# Patient Record
Sex: Male | Born: 1980 | Race: White | Hispanic: No | Marital: Married | Smoking: Former smoker
Health system: Southern US, Community
[De-identification: ages and names within clinical notes are randomized; demographics above are authoritative.]

## PROBLEM LIST (undated history)

## (undated) DIAGNOSIS — J40 Bronchitis, not specified as acute or chronic: Secondary | ICD-10-CM

## (undated) DIAGNOSIS — K2270 Barrett's esophagus without dysplasia: Secondary | ICD-10-CM

## (undated) DIAGNOSIS — K219 Gastro-esophageal reflux disease without esophagitis: Secondary | ICD-10-CM

## (undated) HISTORY — PX: WISDOM TOOTH EXTRACTION: SHX21

## (undated) HISTORY — DX: Barrett's esophagus without dysplasia: K22.70

## (undated) HISTORY — DX: Gastro-esophageal reflux disease without esophagitis: K21.9

## (undated) HISTORY — DX: Bronchitis, not specified as acute or chronic: J40

---

## 2002-10-08 ENCOUNTER — Encounter: Payer: Self-pay | Admitting: *Deleted

## 2002-10-08 ENCOUNTER — Emergency Department (HOSPITAL_COMMUNITY): Admission: EM | Admit: 2002-10-08 | Discharge: 2002-10-08 | Payer: Self-pay | Admitting: *Deleted

## 2002-10-10 ENCOUNTER — Emergency Department (HOSPITAL_COMMUNITY): Admission: EM | Admit: 2002-10-10 | Discharge: 2002-10-10 | Payer: Self-pay | Admitting: Emergency Medicine

## 2006-02-12 ENCOUNTER — Emergency Department: Payer: Self-pay | Admitting: Emergency Medicine

## 2006-05-03 ENCOUNTER — Emergency Department (HOSPITAL_COMMUNITY): Admission: EM | Admit: 2006-05-03 | Discharge: 2006-05-03 | Payer: Self-pay | Admitting: Family Medicine

## 2006-12-01 ENCOUNTER — Emergency Department: Payer: Self-pay | Admitting: Emergency Medicine

## 2007-07-05 ENCOUNTER — Emergency Department: Payer: Self-pay | Admitting: Internal Medicine

## 2009-12-10 ENCOUNTER — Ambulatory Visit: Payer: Self-pay | Admitting: Internal Medicine

## 2010-01-10 ENCOUNTER — Ambulatory Visit: Payer: Self-pay | Admitting: Internal Medicine

## 2010-01-31 ENCOUNTER — Ambulatory Visit (HOSPITAL_COMMUNITY)
Admission: RE | Admit: 2010-01-31 | Discharge: 2010-01-31 | Payer: Self-pay | Source: Home / Self Care | Attending: Internal Medicine | Admitting: Internal Medicine

## 2010-01-31 ENCOUNTER — Ambulatory Visit: Admit: 2010-01-31 | Payer: Self-pay | Admitting: Internal Medicine

## 2010-02-15 NOTE — Op Note (Signed)
  [name], Charles Vincent             ACCOUNT NO.:  [id number]  MEDICAL RECORD NO.:  [id number]          PATIENT TYPE:  AMB  LOCATION:  DAY                           FACILITY:  [facility]  PHYSICIAN:  Charles Vincent, M.D.    DATE OF BIRTH:  Charles Vincent, Charles Vincent  DATE OF PROCEDURE: DATE OF DISCHARGE:  01/31/2010                              OPERATIVE REPORT   PROCEDURE:  Esophagogastroduodenoscopy.  INDICATION:  Charles Vincent is a 30 year old Caucasian male who has had symptoms of GERD for more than 10 years.  He was not doing well with omeprazole, but he is feeling better with Dexilant.  He is undergoing diagnostic EGD to make sure he does not have Barrett esophagus or complicated GERD.  Procedure risks were reviewed with the patient. Informed consent was obtained.  MEDICATIONS FOR CONSCIOUS SEDATION: 1. Cetacaine spray for pharyngeal topical anesthesia. 2. Demerol 50 mg IV. 3. Versed 8 mg IV.  FINDINGS:  Procedure performed in endoscopy suite.  The patient's vital signs and O2 sat were monitored during the procedure and remained stable.  The patient was placed in left lateral recumbent position and Pentax videoscope was passed through oropharynx without any difficulty into esophagus.  Esophagus:  Mucosa of the esophagus normal.  GE junction was located at 37 cm from the incisors and was wavy or serrated.  No erosions or ulcers were noted.  On the way out biopsy was taken from GE junction to make sure we are not dealing with short segment Barrett.  Hiatus was at 39 cm.  Stomach:  It was emptied and distended very well with insufflation. Folds of proximal stomach are normal.  Examination of the mucosa at body, antrum, pyloric channel as well as angularis, fundus, and cardia was normal.  Duodenum and bulbar mucosa was normal.  Scope was passed into second part of the duodenum where mucosa and folds were normal.  Endoscope was withdrawn.  The patient tolerated the procedure well.  FINAL  DIAGNOSES: 1. 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  Biopsy taken to rule out short segment Barrett. 2. A 2-cm size sliding hiatal hernia. 3. Normal examination in the stomach, first and second part of the     duodenum.  RECOMMENDATIONS:  We will continue antireflux measures and Dexilant as before.  I will be contacting the patient with results of biopsy and further recommendations.     Charles Vincent, M.D.     NR/MEDQ  D:  01/31/2010  T:  02/01/2010  Job:  [id number]  cc:   Dr. Phillips Odor  Electronically Signed by Charles Vincent M.D. on 02/15/2010 01:03:02 PM

## 2011-01-16 ENCOUNTER — Emergency Department (HOSPITAL_COMMUNITY)
Admission: EM | Admit: 2011-01-16 | Discharge: 2011-01-17 | Discharge status: Against Medical Advice | Payer: BC Managed Care – PPO | Attending: Emergency Medicine | Admitting: Emergency Medicine

## 2011-01-16 ENCOUNTER — Emergency Department (HOSPITAL_COMMUNITY): Admission: EM | Admit: 2011-01-16 | Discharge: 2011-02-14 | Disposition: E | Payer: Self-pay

## 2011-01-16 ENCOUNTER — Emergency Department (HOSPITAL_COMMUNITY)
Admission: EM | Admit: 2011-01-16 | Discharge: 2011-01-16 | Disposition: A | Discharge status: Home / Self Care | Payer: BC Managed Care – PPO | Attending: Emergency Medicine | Admitting: Emergency Medicine

## 2011-01-16 ENCOUNTER — Emergency Department (HOSPITAL_COMMUNITY): Payer: BC Managed Care – PPO

## 2011-01-16 DIAGNOSIS — IMO0001 Reserved for inherently not codable concepts without codable children: Secondary | ICD-10-CM | POA: Insufficient documentation

## 2011-01-16 DIAGNOSIS — R059 Cough, unspecified: Secondary | ICD-10-CM | POA: Insufficient documentation

## 2011-01-16 DIAGNOSIS — R05 Cough: Secondary | ICD-10-CM | POA: Insufficient documentation

## 2011-01-16 DIAGNOSIS — M25529 Pain in unspecified elbow: Secondary | ICD-10-CM | POA: Insufficient documentation

## 2011-01-16 DIAGNOSIS — R509 Fever, unspecified: Secondary | ICD-10-CM | POA: Insufficient documentation

## 2011-01-16 DIAGNOSIS — R21 Rash and other nonspecific skin eruption: Secondary | ICD-10-CM | POA: Insufficient documentation

## 2011-01-16 DIAGNOSIS — M25519 Pain in unspecified shoulder: Secondary | ICD-10-CM | POA: Insufficient documentation

## 2011-01-16 DIAGNOSIS — Z0389 Encounter for observation for other suspected diseases and conditions ruled out: Secondary | ICD-10-CM | POA: Insufficient documentation

## 2011-01-16 DIAGNOSIS — M25559 Pain in unspecified hip: Secondary | ICD-10-CM | POA: Insufficient documentation

## 2011-01-16 DIAGNOSIS — M25539 Pain in unspecified wrist: Secondary | ICD-10-CM | POA: Insufficient documentation

## 2011-01-16 DIAGNOSIS — Z79899 Other long term (current) drug therapy: Secondary | ICD-10-CM | POA: Insufficient documentation

## 2011-01-16 DIAGNOSIS — M791 Myalgia, unspecified site: Secondary | ICD-10-CM

## 2011-01-16 DIAGNOSIS — F17200 Nicotine dependence, unspecified, uncomplicated: Secondary | ICD-10-CM | POA: Insufficient documentation

## 2011-01-16 LAB — BASIC METABOLIC PANEL
BUN: 9 mg/dL (ref 6–23)
CO2: 25 mEq/L (ref 19–32)
Chloride: 105 mEq/L (ref 96–112)
Creatinine, Ser: 0.71 mg/dL (ref 0.50–1.35)
Glucose, Bld: 105 mg/dL — ABNORMAL HIGH (ref 70–99)

## 2011-01-16 LAB — CBC
HCT: 40.5 % (ref 39.0–52.0)
Hemoglobin: 13.8 g/dL (ref 13.0–17.0)
MCH: 29.5 pg (ref 26.0–34.0)
MCV: 86.5 fL (ref 78.0–100.0)
Platelets: 303 10*3/uL (ref 150–400)
RBC: 4.68 MIL/uL (ref 4.22–5.81)
WBC: 15.2 10*3/uL — ABNORMAL HIGH (ref 4.0–10.5)

## 2011-01-16 LAB — SEDIMENTATION RATE: Sed Rate: 6 mm/hr (ref 0–16)

## 2011-01-16 MED ORDER — DIPHENHYDRAMINE HCL 50 MG/ML IJ SOLN
25.0000 mg | Freq: Once | INTRAMUSCULAR | Status: AC
  Administered 2011-01-16: 25 mg via INTRAVENOUS
  Filled 2011-01-16: qty 1

## 2011-01-16 MED ORDER — SODIUM CHLORIDE 0.9 % IV BOLUS (SEPSIS)
1000.0000 mL | Freq: Once | INTRAVENOUS | Status: AC
  Administered 2011-01-16: 1000 mL via INTRAVENOUS

## 2011-01-16 MED ORDER — KETOROLAC TROMETHAMINE 30 MG/ML IJ SOLN
30.0000 mg | Freq: Once | INTRAMUSCULAR | Status: AC
  Administered 2011-01-16: 30 mg via INTRAVENOUS
  Filled 2011-01-16: qty 1

## 2011-01-16 MED ORDER — DOXYCYCLINE HYCLATE 100 MG PO TABS
100.0000 mg | ORAL_TABLET | Freq: Once | ORAL | Status: AC
  Administered 2011-01-16: 100 mg via ORAL
  Filled 2011-01-16: qty 1

## 2011-01-16 MED ORDER — METHYLPREDNISOLONE SODIUM SUCC 125 MG IJ SOLR
125.0000 mg | Freq: Once | INTRAMUSCULAR | Status: AC
  Administered 2011-01-16: 125 mg via INTRAVENOUS
  Filled 2011-01-16: qty 2

## 2011-01-16 MED ORDER — DOXYCYCLINE HYCLATE 100 MG PO CAPS: 100.0000 mg | ORAL_CAPSULE | Freq: Two times a day (BID) | ORAL | Status: DC

## 2011-01-16 NOTE — ED Notes (Signed)
Family at bedside. 

## 2011-01-16 NOTE — ED Notes (Signed)
Patient is resting comfortably. No acute changes in pt status. VSS. Resp are unlabored. Family at bedside.

## 2011-01-16 NOTE — ED Notes (Signed)
Pt states he took a load of "nasty stuff..couch, carpet etc and some was molded" on Monday. Onset symptoms on Tuesday. Has rasch to trunk and extremeties. States has run fever of 99.5 daily since onset symptoms. Denies sore throat prior to onset rash. Denies n/v/d

## 2011-01-16 NOTE — ED Provider Notes (Signed)
Patient feeling better after IV fluids and medications.  Patient will be discharged home with doxycycline and follow-up by his PCP tomorrow as scheduled.  Jimmye Norman, NP 02-03-2011 1842

## 2011-01-16 NOTE — ED Notes (Signed)
Pt prepared for transport to CDU.

## 2011-01-16 NOTE — ED Notes (Signed)
Rash all over his body was seen by  [facility] yesterday and given a depo injection.  Pt. Believes the rash is getting worse. Airway intact,  no resp. Distress, joint pain

## 2011-01-16 NOTE — ED Provider Notes (Signed)
History     CSN: [id number]  Arrival date & time Jan 28, 2011  1131   First MD Initiated Contact with Patient 2011-01-28 1226      Chief Complaint  Patient presents with  . Rash    (Consider location/radiation/quality/duration/timing/severity/associated sxs/prior treatment) HPI Patient presents with diffuse rash as well as joint pain and cough. Symptoms began 2 days ago with splotchy red rash. Throughout the day he developed right hand pain shoulder pain and hip pain. He has had a low-grade fever approximately 99-100. He was seen in his doctor's office yesterday and given a steroid injection. Today he began having more itching as well as increased cough. He complains of joint pain in his wrists elbows shoulders and hips. He denies any preceding illnesses such as fever sore throat URI symptoms prior to onset of his current symptoms. He denies any difficulty breathing or vomiting. There no alleviating or modifying factors. There no other associated systemic symptoms. History reviewed. No pertinent past medical history.  History reviewed. No pertinent past surgical history.  History reviewed. No pertinent family history.  History  Substance Use Topics  . Smoking status: Current Everyday Smoker  . Smokeless tobacco: Not on file  . Alcohol Use: No      Review of Systems ROS reviewed and otherwise negative except for mentioned in HPI  Allergies  Review of patient's allergies indicates no known allergies.  Home Medications   Current Outpatient Rx  Name Route Sig Dispense Refill  . DEXLANSOPRAZOLE 60 MG PO CPDR Oral Take 60 mg by mouth daily.      . IBUPROFEN 200 MG PO TABS Oral Take 800 mg by mouth 2 (two) times daily as needed. For joint pain     . DOXYCYCLINE HYCLATE 100 MG PO CAPS Oral Take 100 mg by mouth 2 (two) times daily.        BP 122/68  Pulse 89  Temp(Src) 99.5 F (37.5 C) (Oral)  Resp 19  Ht 5\' 8"  (1.727 m)  Wt 264 lb (119.75 kg)  BMI 40.14 kg/m2  SpO2 96% Vitals  reviewed Physical Exam Physical Examination: General appearance - alert, well appearing, and in no distress Mental status - alert, oriented to person, place, and time Eyes - pupils equal and reactive, no conjunctival injection Mouth - mucous membranes moist, pharynx normal without lesions Chest - clear to auscultation, no wheezes, rales or rhonchi, symmetric air entry Abdomen - soft, nontender, nondistended, no masses or organomegaly Musculoskeletal - no joint tenderness, deformity or swelling Extremities - peripheral pulses normal, no pedal edema, no clubbing or cyanosis Skin - warm, dry, scattered erythematous wheals with excoriations over arms, back, chest, abdomen, upper legs  ED Course  Procedures (including critical care time)  1:56 PM pt moved to CDU- careplan discussed with Thomasene Lot who will continue with his management under my supervision  Labs Reviewed  CBC - Abnormal; Notable for the following:    WBC 15.2 (*)    All other components within normal limits  BASIC METABOLIC PANEL - Abnormal; Notable for the following:    Glucose, Bld 105 (*)    All other components within normal limits  SEDIMENTATION RATE  RAPID STREP SCREEN  ROCKY MTN SPOTTED FVR AB, IGG-BLOOD  LAB REPORT - SCANNED   No results found.   1. Myalgia       MDM  Pt with rash, low grade fever, joint pains.  No hx of travel, tick bite, recent URI/sore throat.  Pt received IV hydration for  tachcardia and toradol for arthralgias.  Labs revealed elevated WBC, rapid strep negative.  No elevation in ESR.  Lyme and RMSF titres sent.  Pt HR down, feels much improved.  Started on doxycycline while titres pending. Discharged with strict return precautions.  Pt agreeable with plan.        Ethelda Chick, MD 01/20/11 [phone]

## 2011-01-16 NOTE — Discharge Instructions (Signed)
Follow-up with Dr. Sudie Bailey tomorrow as scheduled.  Myalgia, Adult Myalgia is the medical term for muscle pain. It is a symptom of many things. Nearly everyone at some time in their life has this. The most common cause for muscle pain is overuse or straining and more so when you are not in shape. Injuries and muscle bruises cause myalgias. Muscle pain without a history of injury can also be caused by a virus. It frequently comes along with the flu. Myalgia not caused by muscle strain can be present in a large number of infectious diseases. Some autoimmune diseases like lupus and fibromyalgia can cause muscle pain. Myalgia may be mild, or severe. SYMPTOMS  The symptoms of myalgia are simply muscle pain. Most of the time this is short lived and the pain goes away without treatment. DIAGNOSIS  Myalgia is diagnosed by your caregiver by taking your history. This means you tell him when the problems began, what they are, and what has been happening. If this has not been a long term problem, your caregiver may want to watch for a while to see what will happen. If it has been long term, they may want to do additional testing. TREATMENT  The treatment depends on what the underlying cause of the muscle pain is. Often anti-inflammatory medications will help. HOME CARE INSTRUCTIONS  If the pain in your muscles came from overuse, slow down your activities until the problems go away.   Myalgia from overuse of a muscle can be treated with alternating hot and cold packs on the muscle affected or with cold for the first couple days. If either heat or cold seems to make things worse, stop their use.   Apply ice to the sore area for 15 to 20 minutes, 3 to 4 times per day, while awake for the first 2 days of muscle soreness, or as directed. Put the ice in a plastic bag and place a towel between the bag of ice and your skin.   Only take over-the-counter or prescription medicines for pain, discomfort, or fever as  directed by your caregiver.   Regular gentle exercise may help if you are not active.   Stretching before strenuous exercise can help lower the risk of myalgia. It is normal when beginning an exercise regimen to feel some muscle pain after exercising. Muscles that have not been used frequently will be sore at first. If the pain is extreme, this may mean injury to a muscle.  SEEK MEDICAL CARE IF:  You have an increase in muscle pain that is not relieved with medication.   You begin to run a temperature.   You develop nausea and vomiting.   You develop a stiff and painful neck.   You develop a rash.   You develop muscle pain after a tick bite.   You have continued muscle pain while working out even after you are in good condition.  SEEK IMMEDIATE MEDICAL CARE IF: Any of your problems are getting worse and medications are not helping. MAKE SURE YOU:   Understand these instructions.   Will watch your condition.   Will get help right away if you are not doing well or get worse.  Document Released: 11/21/2005 Document Revised: 09/11/2010 Document Reviewed: 02/10/2006 [facility] Patient Information 2012 [place], [place].

## 2011-01-16 NOTE — ED Provider Notes (Signed)
Medical screening examination/treatment/procedure(s) were conducted as a shared visit with non-physician practitioner(s) and myself.  I personally evaluated the patient during the encounter  I saw this patient primarily  Ethelda Chick, MD 22-Jan-2011 [phone]

## 2011-01-16 NOTE — ED Notes (Signed)
MD at bedside. 

## 2011-01-16 NOTE — ED Notes (Signed)
Patient is resting comfortably. Received report. Assumed care. Pt reports mild joint pain. Appears in no acute distress. SKin warm and dry. Resp are unlabored. IV site SL and benign. Updated on plan of care. Offered meal tray, s/o is going to buy patient meal. Denies other needs or complaints.

## 2011-01-16 NOTE — ED Notes (Signed)
Pt has raised areas on all four extremities as well as his torso and back side.  He c/o mild itching but reports that he is having severe joint pain throughout his body.  He reports that he was given a steroid injection yesterday in his PCP's office and has seen no relief since that administration.

## 2011-02-14 DEATH — deceased

## 2011-03-13 ENCOUNTER — Ambulatory Visit (INDEPENDENT_AMBULATORY_CARE_PROVIDER_SITE_OTHER): Payer: BC Managed Care – PPO | Admitting: Internal Medicine

## 2011-03-13 ENCOUNTER — Encounter (INDEPENDENT_AMBULATORY_CARE_PROVIDER_SITE_OTHER): Payer: Self-pay | Admitting: Internal Medicine

## 2011-03-13 VITALS — BP 110/78 | HR 72 | Temp 98.3°F | Ht 68.0 in | Wt 262.6 lb

## 2011-03-13 DIAGNOSIS — K2270 Barrett's esophagus without dysplasia: Secondary | ICD-10-CM | POA: Insufficient documentation

## 2011-03-13 DIAGNOSIS — K219 Gastro-esophageal reflux disease without esophagitis: Secondary | ICD-10-CM | POA: Insufficient documentation

## 2011-03-13 NOTE — Progress Notes (Signed)
Subjective:     Patient ID: Charles Vincent, male   DOB: [birth year], 31 y.o.   MRN: [id number]  HPI Charles Vincent is a 31 yr old male here today for f/u.  He underwent a EGD in January of last year for intractqble GERD. See below.  He says he is doing good. He has only had 2 episodes of acid reflux just before going to sleep.  He is able to eat what he wants. If he eats [place] and goes to bed he will have acid reflux.  Appetite is good. No weight loss. No abdominal pain. He usually has a BM about once a day.  No dysphagia. 01/31/2010 EGD: Intestinal metaplasia, consistent with Barrett's esophagus.  Review of Systems see hpi Current Outpatient Prescriptions  Medication Sig Dispense Refill  . dexlansoprazole (DEXILANT) 60 MG capsule Take 60 mg by mouth daily.        Marland Kitchen ibuprofen (ADVIL,MOTRIN) 200 MG tablet Take 800 mg by mouth 2 (two) times daily as needed. For joint pain        Past Surgical History  Procedure Date  . Wisdom tooth extraction    Past Medical History  Diagnosis Date  . GERD (gastroesophageal reflux disease)   . Barrett's esophagus    History reviewed. No pertinent family history. History   Social History  . Marital Status: Married    Spouse Name: N/A    Number of Children: N/A  . Years of Education: N/A   Occupational History  . Not on file.   Social History Main Topics  . Smoking status: Current Everyday Smoker  . Smokeless tobacco: Not on file   Comment: 1 pack a day x  25yrs  . Alcohol Use: Yes     social  . Drug Use: No  . Sexually Active: Not on file   Other Topics Concern  . Not on file   Social History Narrative  . No narrative on file   Family Status  Relation Status Death Age  . Mother Alive     Macular degeneration, Disc disease  . Father Alive     HTN, high cholesterol  . Sister Alive     good health  . Brother Alive     good health   No Known Allergies     Objective:   Physical Exam  Filed Vitals:   03/13/11 1606  Height: 5\' 8"   (1.727 m)  Weight: 262 lb 9.6 oz (119.115 kg)   Alert and oriented. Skin warm and dry. Oral mucosa is moist.   . Sclera anicteric, conjunctivae is pink. Thyroid not enlarged. No cervical lymphadenopathy. Lungs clear. Heart regular rate and rhythm.  Abdomen is soft. Bowel sounds are positive. No hepatomegaly. No abdominal masses felt. No tenderness.  No edema to lower extremities. Patient is alert and oriented.     Assessment:   Short segment Barrett's esophagus. GERD which is controlled at this time.      Plan:    EGD in 2 yrs. I discussed this case with Dr. Karilyn Cota.

## 2011-03-13 NOTE — Patient Instructions (Addendum)
Barrett's Esophagus The esophagus is the muscular tube that carries food and saliva from the mouth to the stomach. Barrett's esophagus involves changes in the esophagus. Some of its lining is replaced by a type of tissue similar to that found in the intestine. This process is called intestinal metaplasia. While Barrett's esophagus may cause no symptoms itself, a small number of people with this condition develop a relatively rare but often deadly type of cancer of the esophagus. It is called esophageal adenocarcinoma. Barrett's esophagus is associated with the common condition called GERD (gastroesophageal reflux disease). HOW THE ESOPHAGUS WORKS The esophagus carries food, liquids, and saliva from the mouth to the stomach. The stomach acts as a container to start digestion and pump food and liquids into the intestines in a controlled process. Food can then be properly digested over time. Nutrients can be taken in (absorbed) by the intestines. The esophagus moves food to the stomach by coordinated contractions of its muscular lining. This process is automatic. People are usually not aware of it. Many people have felt their esophagus when they:  Swallow something too large.     Try to eat too quickly.     Drink very hot or cold liquids.  They then feel the movement of the food or drink down the esophagus into the stomach. This may be an uncomfortable feeling. DIGESTIVE TRACT The muscular layers of the esophagus are normally pinched together at both the upper and lower ends by muscles. These muscles are called sphincters. When a person swallows, the sphincters relax automatically. This allows food or drink to pass from the mouth, into the stomach. The muscles then close rapidly. This prevents the swallowed food or drink from leaking out of the stomach, back into the esophagus or into the mouth. These muscles make it possible to swallow while lying down or even upside-down. When people belch to release  swallowed air or gas from carbonated beverages, the sphincters relax. Then small amounts of food or drink may come back up, briefly. This condition is called reflux. The esophagus quickly squeezes the material back into the stomach. This is considered normal. These functions of the esophagus are an important part of everyday life. However, people who must have their esophagus removed, for example because of cancer, can live a relatively healthy life without it. GERD (GASTROESOPHAGEAL REFLUX DISEASE) Having some stomach contents (liquids or gas) sometimes reflux (come back up from the stomach into the esophagus) is considered normal. When it happens often, and causes other symptoms, it is considered a medical problem or disease. However, it is not necessarily a serious one or one that requires seeing a caregiver. The stomach produces acid and enzymes to digest food. When this mixture refluxes into the esophagus more often than normal or for a longer period of time than normal, it may produce symptoms. These symptoms are often called acid reflux. They are often described by people as heartburn, indigestion, or "gas". The symptoms often consist of a burning sensation below and behind the lower part of the breastbone or sternum. Almost everyone has experienced these symptoms at least once. This is typically a result of overeating. Other things that provoke GERD symptoms include:  Being overweight.     Eating certain types of foods.     Being pregnant.  In most people, GERD symptoms may last only a short time and require no treatment at all.   More continual symptoms are often quickly relieved by over-the-counter acid-reducing agents, such as antacids. Other  drugs used to relieve GERD symptoms are antisecretory drugs, such as histamine2 (H2) blockers or proton pump inhibitors. People who have symptoms often should talk with their caregiver. Other diseases can have similar symptoms. Prescription medicines,  together with other actions, might be needed to reduce reflux. GERD that is untreated over a long period can lead to problems. An example is an ulcer in the esophagus, that could cause bleeding. Another common problem is scar tissue that blocks the movement of swallowed food and drink through the esophagus. This condition is called stricture. Esophageal reflux may also cause certain less common symptoms. These include hoarseness or chronic cough. It sometimes provokes conditions such as asthma. While most patients find that lifestyle changes and acid-blocking drugs relieve their symptoms, caregivers sometimes advise surgery. Overall, GERD is one of the most common medical conditions. About 20 percent of the population can be affected over a lifetime. GERD AND BARRETT'S ESOPHAGUS The exact causes of Barrett's esophagus are not known. It is thought to be caused in part by the same factors that cause GERD. People who do not have heartburn can have Barrett's esophagus. However, it is found about 3 to 5 times more often in people with this condition. Barrett's esophagus is uncommon in children. The average age at diagnosis is 79. But it is usually difficult to know when the problem started. It is about twice as common in men as in women. It is much more common in white men than in men of other races. BARRETT'S ESOPHAGUS AND CANCER OF THE ESOPHAGUS Barrett's esophagus does not cause symptoms itself. However, it seems to precede the development of a particular kind of cancer. This cancer is esophageal adenocarcinoma. The risk of developing this cancer is 30 to 125 times higher in people who have Barrett's esophagus than in people who do not. This type of cancer is increasing quickly in white men. The increase is possibly related to the rise in obesity and GERD. For people who have Barrett's esophagus, the risk of getting cancer of the esophagus is small. It is less than 1 percent (0.4% to 0.5%) per year. Esophageal  adenocarcinoma is often not curable. This is partly because the disease is often discovered at a late stage and treatments are not effective. DIAGNOSIS Diagnosing Barrett's esophagus is not easy. At the present time it cannot be diagnosed based on symptoms, physical exam, or blood tests. The only useful test is upper gastrointestinal endoscopy and biopsy. In this procedure, a flexible tube called an endoscope is used. This tool is like a pencil sized flexible telescope. It has a light and tiny camera. It is passed into the esophagus. If the tissue appears suspicious to your caregiver, biopsies must be done. A biopsy is the removal of a small piece of tissue. This is done using a pincher-like device passed through the endoscope. A pathologist is a specialist who examines body tissue samples. He or she examines the tissue under a microscope to confirm the diagnosis. Looking for a medical problem in people who do not know whether they have one is called screening. Currently, there are no commonly accepted guidelines on who should have endoscopy, to check for Barrett's esophagus. There are many reasons for the lack of firm recommendations about screening. Among them are the great cost and occasional risk of side effects of the test. Also, the rate of finding Barrett's esophagus is low. Finding the problem early has not been proven to prevent deaths from cancer. Many caregivers advise that adult  patients who are over the age of 61 and have had GERD symptoms for a number of years have endoscopy, to see whether they have Barrett's esophagus. Screening for this condition in people who have no symptoms is not advised. TREATMENT   Barrett's esophagus has no cure, other than surgical removal of the esophagus. This is a serious operation. Surgery is advised only for people who have a high risk of developing cancer or who already have it. Most caregivers recommend treating GERD with acid-blocking drugs. This is sometimes  linked to improvement in the extent of the Barrett's tissue. But this approach has not been proven to reduce the risk of cancer. Treating reflux with surgery for GERD also does not seem to cure Barrett's esophagus. Several experimental approaches are under study. One attempts to see whether destroying the Barrett's tissue by heat or other means, through an endoscope, can get rid of the condition. But this approach has potential risks and unknown effectiveness. LOOKING FOR DYSPLASIA AND CANCER Occasional endoscopic examinations to look for early warning signs of cancer are generally advised for people who have Barrett's esophagus. This approach is called surveillance. When people who have Barrett's esophagus develop cancer, the process seems to go through an intermediate stage. In this stage cancer cells appear in the Barrett's tissue. This condition is called dysplasia. It can be seen only in biopsies with a microscope. The process is patchy and cannot be seen directly through the endoscope. So, multiple biopsies must be taken. Even then, it can be missed. The process of change from Barrett's to cancer seems to happen only in a few patients. It is less than 1 percent per year. And it happens over a relatively long period of time. Most caregivers advise that patients with Barrett's esophagus undergo occasional endoscopy to have biopsies. The recommended time between endoscopies varies depending on circumstances. The best time interval has not been decided. TREATMENT FOR DYSPLASIA OR ESOPHAGEAL ADENOCARCINOMA If a person with Barrett's esophagus is found to have dysplasia or cancer, the caregiver will usually recommend surgery. This is if the person is strong enough and has a good chance of being cured. The type of surgery may vary. But it usually involves removing most of the esophagus and pulling the stomach up into the chest to attach it to what remains of the esophagus. Many patients with Barrett's esophagus  are elderly. They may have many other medical problems that make surgery unwise. In these patients, other methods to treat dysplasia are being studied. IMPORTANT POINTS TO REMEMBER  In Barrett's esophagus, the cells lining the esophagus change. They become similar to the cells lining the intestine.     Barrett's esophagus is connected with gastroesophageal reflux disease or GERD.     A small number of people with Barrett's esophagus may develop esophageal cancer.     Barrett's esophagus is diagnosed by upper gastrointestinal endoscopy and biopsy.     People who have Barrett's esophagus should have periodic esophageal exams.     Taking acid-blocking drugs for GERD may help improve Barrett's esophagus.     Removal of the esophagus is recommended only for people who have a high risk of developing cancer or who already have it.  FOR MORE INFORMATION International Foundation for Functional Gastrointestinal Disorders (IFFGD): www.iffgd.org Document Released: 03/22/2003 Document Revised: 09/11/2010 Document Reviewed: 12/27/2007 [facility] Patient Information 2012 [place], [place].   EGD in 2 yrs. Continue the Dexilant.

## 2011-05-17 ENCOUNTER — Other Ambulatory Visit (HOSPITAL_COMMUNITY): Payer: Self-pay | Admitting: Family Medicine

## 2011-05-17 ENCOUNTER — Ambulatory Visit (HOSPITAL_COMMUNITY)
Admission: RE | Admit: 2011-05-17 | Discharge: 2011-05-17 | Disposition: A | Discharge status: Home / Self Care | Payer: BC Managed Care – PPO | Source: Ambulatory Visit | Attending: Family Medicine | Admitting: Family Medicine

## 2011-05-17 DIAGNOSIS — R05 Cough: Secondary | ICD-10-CM

## 2011-05-17 DIAGNOSIS — R079 Chest pain, unspecified: Secondary | ICD-10-CM | POA: Insufficient documentation

## 2011-05-17 DIAGNOSIS — R059 Cough, unspecified: Secondary | ICD-10-CM | POA: Insufficient documentation

## 2011-11-28 ENCOUNTER — Other Ambulatory Visit (HOSPITAL_COMMUNITY): Payer: Self-pay | Admitting: Physician Assistant

## 2011-11-28 DIAGNOSIS — R51 Headache: Secondary | ICD-10-CM

## 2011-12-01 ENCOUNTER — Ambulatory Visit (HOSPITAL_COMMUNITY)
Admission: RE | Admit: 2011-12-01 | Discharge: 2011-12-01 | Disposition: A | Discharge status: Home / Self Care | Payer: BC Managed Care – PPO | Source: Ambulatory Visit | Attending: Physician Assistant | Admitting: Physician Assistant

## 2011-12-01 DIAGNOSIS — R51 Headache: Secondary | ICD-10-CM | POA: Insufficient documentation

## 2012-11-14 ENCOUNTER — Other Ambulatory Visit (INDEPENDENT_AMBULATORY_CARE_PROVIDER_SITE_OTHER): Payer: Self-pay | Admitting: Internal Medicine

## 2012-11-18 NOTE — Telephone Encounter (Signed)
Apt has been scheduled for 02/21/13 with Dr. Karilyn Cota.

## 2013-02-15 ENCOUNTER — Encounter (INDEPENDENT_AMBULATORY_CARE_PROVIDER_SITE_OTHER): Payer: Self-pay | Admitting: Internal Medicine

## 2013-02-15 ENCOUNTER — Ambulatory Visit (INDEPENDENT_AMBULATORY_CARE_PROVIDER_SITE_OTHER): Payer: BC Managed Care – PPO | Admitting: Internal Medicine

## 2013-02-15 VITALS — BP 130/90 | HR 78 | Temp 98.0°F | Resp 18 | Ht 68.0 in | Wt 269.5 lb

## 2013-02-15 DIAGNOSIS — K2270 Barrett's esophagus without dysplasia: Secondary | ICD-10-CM

## 2013-02-15 DIAGNOSIS — K219 Gastro-esophageal reflux disease without esophagitis: Secondary | ICD-10-CM

## 2013-02-15 MED ORDER — DEXLANSOPRAZOLE 60 MG PO CPDR: 60.0000 mg | DELAYED_RELEASE_CAPSULE | Freq: Every day | ORAL | Status: DC

## 2013-02-15 NOTE — Progress Notes (Signed)
Presenting complaint;  Followup for GERD.  Subjective:  Patient is 33 year old Caucasian male who has a several year history of heartburn. 3 years ago he underwent esophagogastroduodenoscopy and noted to have small sliding hiatal hernia and short segment Barrett's esophagus. He has been maintained PPIs since then. He states as long as he takes this medication he has no problems. If he forgets or masses he starts having heartburn. He quit cigarette smoking in 09/28/2012 and has gained 25 pounds since then. He has changed his eating habits and has eliminated colas from his diet. He has also joined the [employer]. He denies hoarseness chronic cough or sore throat. He also denies dysphagia abdominal pain melena or rectal bleeding. He is not having any side effects with PPI.  Current Medications: Current Outpatient Prescriptions  Medication Sig Dispense Refill  . DEXILANT 60 MG capsule TAKE 1 CAPSULE EVERY DAY  30 capsule  5  . ibuprofen (ADVIL,MOTRIN) 200 MG tablet Take 800 mg by mouth 2 (two) times daily as needed. For joint pain        No current facility-administered medications for this visit.     Objective: Blood pressure 130/90, pulse 78, temperature 98 F (36.7 C), temperature source Oral, resp. rate 18, height 5\' 8"  (1.727 m), weight 269 lb 8 oz (122.244 kg). Patient is alert and in no acute distress. Conjunctiva is pink. Sclera is nonicteric Oropharyngeal mucosa is normal. No neck masses or thyromegaly noted. Cardiac exam with regular rhythm normal S1 and S2. No murmur or gallop noted. Lungs are clear to auscultation. Abdomen is obese but soft and nontender without organomegaly or masses.  No LE edema or clubbing noted.   Assessment:  Chronic GERD complicated by short segment Barrett's esophagus. Last EGD and biopsy was in January 2012. His symptoms are well controlled with therapy. Will consider dropping the dose if he is able to lose some weight. He has quit cigarette  smoking which should also help.    Plan:  New prescription for Dexilant 60 mg given for 30 doses with 11 refills. Unless he has problems he will return for office visit in one year. May consider followup EGD in 2 years from now.

## 2013-02-15 NOTE — Patient Instructions (Signed)
Call if Dexilant stops working 

## 2013-02-21 ENCOUNTER — Ambulatory Visit (INDEPENDENT_AMBULATORY_CARE_PROVIDER_SITE_OTHER): Payer: BC Managed Care – PPO | Admitting: Internal Medicine

## 2013-11-02 ENCOUNTER — Encounter (INDEPENDENT_AMBULATORY_CARE_PROVIDER_SITE_OTHER): Payer: Self-pay | Admitting: *Deleted

## 2014-02-15 ENCOUNTER — Encounter (INDEPENDENT_AMBULATORY_CARE_PROVIDER_SITE_OTHER): Payer: Self-pay | Admitting: Internal Medicine

## 2014-02-15 ENCOUNTER — Other Ambulatory Visit (INDEPENDENT_AMBULATORY_CARE_PROVIDER_SITE_OTHER): Payer: Self-pay | Admitting: *Deleted

## 2014-02-15 ENCOUNTER — Ambulatory Visit (INDEPENDENT_AMBULATORY_CARE_PROVIDER_SITE_OTHER): Payer: BLUE CROSS/BLUE SHIELD | Admitting: Internal Medicine

## 2014-02-15 ENCOUNTER — Encounter (INDEPENDENT_AMBULATORY_CARE_PROVIDER_SITE_OTHER): Payer: Self-pay | Admitting: *Deleted

## 2014-02-15 VITALS — BP 130/70 | HR 76 | Temp 98.3°F | Ht 68.0 in | Wt 250.0 lb

## 2014-02-15 DIAGNOSIS — K219 Gastro-esophageal reflux disease without esophagitis: Secondary | ICD-10-CM

## 2014-02-15 DIAGNOSIS — K2270 Barrett's esophagus without dysplasia: Secondary | ICD-10-CM

## 2014-02-15 NOTE — Progress Notes (Signed)
   Subjective:    Patient ID: Charles Vincent, male    DOB: 27-Jun-1980, 34 y.o.   MRN: [id number]  HPI Here today for f/u. Last seen in February of 2015. He has lost 19 pounds since his last visit in February of 2015. Hx of short segment Barrett's esophagus and hital hernia diagnosed in 2012 on EGD. He is trying to lose weight. He is watching what he eats. He denies any acid reflux. Appetite is good. BMs are normal Patient has been working out. Patient is concerned due to fact [facility].  Patient quit smoking 1 1/2 yrs.   Review of Systems Past Medical History  Diagnosis Date  . GERD (gastroesophageal reflux disease)   . Barrett's esophagus   . Bronchitis     Past Surgical History  Procedure Laterality Date  . Wisdom tooth extraction      No Known Allergies  Current Outpatient Prescriptions on File Prior to Visit  Medication Sig Dispense Refill  . dexlansoprazole (DEXILANT) 60 MG capsule Take 1 capsule (60 mg total) by mouth daily. (Patient not taking: Reported on 02/15/2014) 30 capsule 11  . ibuprofen (ADVIL,MOTRIN) 200 MG tablet Take 800 mg by mouth 2 (two) times daily as needed. For joint pain      No current facility-administered medications on file prior to visit.        Objective:   Physical Exam  Filed Vitals:   02/15/14 1603  Height: 5\' 8"  (1.727 m)  Weight: 250 lb (113.399 kg)   Alert and oriented. Skin warm and dry. Oral mucosa is moist.   . Sclera anicteric, conjunctivae is pink. Thyroid not enlarged. No cervical lymphadenopathy. Lungs clear. Heart regular rate and rhythm.  Abdomen is soft. Bowel sounds are positive. No hepatomegaly. No abdominal masses felt. No tenderness.  No edema to lower extremities.          Assessment & Plan:  GERD. Denies symptoms at this time. He is practicing a healthier lifestyle. He does use Pepcid chewable prn. Does not eat after 7pm. Needs surveillance EGD

## 2014-02-15 NOTE — Patient Instructions (Addendum)
Surveillance EGD 

## 2014-04-13 ENCOUNTER — Encounter (HOSPITAL_COMMUNITY): Payer: Self-pay | Admitting: *Deleted

## 2014-04-13 ENCOUNTER — Other Ambulatory Visit (INDEPENDENT_AMBULATORY_CARE_PROVIDER_SITE_OTHER): Payer: Self-pay | Admitting: Internal Medicine

## 2014-04-13 ENCOUNTER — Ambulatory Visit (HOSPITAL_COMMUNITY)
Admission: RE | Admit: 2014-04-13 | Discharge: 2014-04-13 | Disposition: A | Discharge status: Home / Self Care | Payer: BLUE CROSS/BLUE SHIELD | Source: Ambulatory Visit | Attending: Internal Medicine | Admitting: Internal Medicine

## 2014-04-13 ENCOUNTER — Encounter (HOSPITAL_COMMUNITY)
Admission: RE | Disposition: A | Discharge status: Home / Self Care | Payer: Self-pay | Source: Ambulatory Visit | Attending: Internal Medicine

## 2014-04-13 DIAGNOSIS — K449 Diaphragmatic hernia without obstruction or gangrene: Secondary | ICD-10-CM | POA: Diagnosis not present

## 2014-04-13 DIAGNOSIS — K2270 Barrett's esophagus without dysplasia: Secondary | ICD-10-CM | POA: Diagnosis not present

## 2014-04-13 DIAGNOSIS — K219 Gastro-esophageal reflux disease without esophagitis: Secondary | ICD-10-CM

## 2014-04-13 DIAGNOSIS — K208 Other esophagitis: Secondary | ICD-10-CM | POA: Diagnosis not present

## 2014-04-13 DIAGNOSIS — Z888 Allergy status to other drugs, medicaments and biological substances status: Secondary | ICD-10-CM | POA: Insufficient documentation

## 2014-04-13 HISTORY — PX: ESOPHAGOGASTRODUODENOSCOPY: SHX5428

## 2014-04-13 SURGERY — EGD (ESOPHAGOGASTRODUODENOSCOPY)
Anesthesia: Moderate Sedation

## 2014-04-13 MED ORDER — BUTAMBEN-TETRACAINE-BENZOCAINE 2-2-14 % EX AERO
INHALATION_SPRAY | CUTANEOUS | Status: DC | PRN
  Administered 2014-04-13: 2 via TOPICAL

## 2014-04-13 MED ORDER — MEPERIDINE HCL 50 MG/ML IJ SOLN
INTRAMUSCULAR | Status: AC
  Filled 2014-04-13: qty 1

## 2014-04-13 MED ORDER — STERILE WATER FOR IRRIGATION IR SOLN
Status: DC | PRN
  Administered 2014-04-13: 14:00:00

## 2014-04-13 MED ORDER — MIDAZOLAM HCL 5 MG/5ML IJ SOLN
INTRAMUSCULAR | Status: DC | PRN
  Administered 2014-04-13: 3 mg via INTRAVENOUS
  Administered 2014-04-13 (×3): 2 mg via INTRAVENOUS

## 2014-04-13 MED ORDER — MIDAZOLAM HCL 5 MG/5ML IJ SOLN
INTRAMUSCULAR | Status: AC
  Filled 2014-04-13: qty 10

## 2014-04-13 MED ORDER — MEPERIDINE HCL 50 MG/ML IJ SOLN
INTRAMUSCULAR | Status: DC | PRN
  Administered 2014-04-13 (×2): 25 mg via INTRAVENOUS

## 2014-04-13 MED ORDER — SODIUM CHLORIDE 0.9 % IV SOLN
INTRAVENOUS | Status: DC
  Administered 2014-04-13: 13:00:00 via INTRAVENOUS

## 2014-04-13 NOTE — Op Note (Signed)
EGD PROCEDURE REPORT  PATIENT:  Charles Vincent  MR#:  [id number] Birthdate:  Jan 14, 1980, 34 y.o., male Endoscopist:  Dr. Malissa HippoNajeeb U. Rehman, MD Referred By:  Dr. Colette RibasJohn Cabot Golding, MD  Procedure Date: 04/13/2014  Procedure:   EGD  Indications:  Patient is 34 year old Caucasian male was chronic GERD complicated by short segment Barrett's esophagus who managed to lose 65 pounds last year and change his eating habits and quit the medication. However he's gained 40 pounds back in to go back on PPI for control of heartburn. Last EGD was in January 2012. He is undergoing EGD primarily for surveillance purposes.            Informed Consent:  The risks, benefits, alternatives & imponderables which include, but are not limited to, bleeding, infection, perforation, drug reaction and potential missed lesion have been reviewed.  The potential for biopsy, lesion removal, esophageal dilation, etc. have also been discussed.  Questions have been answered.  All parties agreeable.  Please see history & physical in medical record for more information.  Medications:  Demerol 50 mg IV Versed 9 mg IV Cetacaine spray topically for oropharyngeal anesthesia  Description of procedure:  The endoscope was introduced through the mouth and advanced to the second portion of the duodenum without difficulty or limitations. The mucosal surfaces were surveyed very carefully during advancement of the scope and upon withdrawal.  Findings:  Esophagus:  Mucosa of the esophagus was normal. Single erosion noted just proximal to GE junction. 3 small patches of salmon-colored mucosa in noted contiguous with GE junction. These patches were less than 10 mm in diameter each. No ring or stricture present. GEJ:  37 cm Hiatus:  39 cm Stomach:  Stomach was empty and distended very well with insufflation. Folds in the proximal stomach were normal. Examination mucosa and gastric body, antrum, pyloric channel, Angus fundus and cardia was  normal. Duodenum:  Normal bulbar and post bulbar mucosa.  Therapeutic/Diagnostic Maneuvers Performed:  Multiple biopsies were taken from GE junction/Barrett's patches.  Complications:  None  Impression: Single erosion at distal esophagus. Short segment Barrett's esophagus with three small small islands contiguous to GE junction. Multiple biopsies taken. Small sliding hiatal hernia.  Recommendations:  Continue anti-reflx measures and Dexilant as before. I will be contacting patient with biopsy results and further recommendations  REHMAN,NAJEEB U  04/13/2014  2:19 PM  CC: Dr. Phillips OdorGOLDING, Chancy HurterJOHN CABOT, MD & Dr. No ref. provider found

## 2014-04-13 NOTE — Discharge Instructions (Signed)
Resume usual medications and diet. °No driving for 24 hours. °Physician will call with biopsy results. ° ° ° ° °Esophagogastroduodenoscopy °Care After °Refer to this sheet in the next few weeks. These instructions provide you with information on caring for yourself after your procedure. Your caregiver may also give you more specific instructions. Your treatment has been planned according to current medical practices, but problems sometimes occur. Call your caregiver if you have any problems or questions after your procedure.  °HOME CARE INSTRUCTIONS °· Do not eat or drink anything until the numbing medicine (local anesthetic) has worn off and your gag reflex has returned. You will know that the local anesthetic has worn off when you can swallow comfortably. °· Do not drive for 12 hours after the procedure or as directed by your caregiver. °· Only take medicines as directed by your caregiver. °SEEK MEDICAL CARE IF:  °· You cannot stop coughing. °· You are not urinating at all or less than usual. °SEEK IMMEDIATE MEDICAL CARE IF: °· You have difficulty swallowing. °· You cannot eat or drink. °· You have worsening throat or chest pain. °· You have dizziness, lightheadedness, or you faint. °· You have nausea or vomiting. °· You have chills. °· You have a fever. °· You have severe abdominal pain. °· You have black, tarry, or bloody stools. °Document Released: 12/17/2011 Document Reviewed: 12/17/2011 °Exit[employer]® Patient Information ©2015 [employer]. This information is not intended to replace advice given to you by your health care provider. Make sure you discuss any questions you have with your health care provider. ° °

## 2014-04-13 NOTE — H&P (Signed)
Charles Vincent is an 34 y.o. male.   Chief Complaint: Patient is here for EGD. HPI: Patient is 34 year old Caucasian male was chronic GERD complicated by short segment Barrett's esophagus was here for surveillance EGD. Last year he lost 65 pounds in stopped taking his medications. However he lost his job and started a new one in has gained 45 pounds back. He began to have heartburn and is back on PPI and doing well. He denies nocturnal regurgitation dysphagia or melena.  Past Medical History  Diagnosis Date  . GERD (gastroesophageal reflux disease)   . Barrett's esophagus   . Bronchitis     Past Surgical History  Procedure Laterality Date  . Wisdom tooth extraction      Family History  Problem Relation Age of Onset  . Healthy Daughter    Social History:  reports that he quit smoking about 18 months ago. He has never used smokeless tobacco. He reports that he drinks alcohol. He reports that he does not use illicit drugs.  Allergies: No Known Allergies  Medications Prior to Admission  Medication Sig Dispense Refill  . ibuprofen (ADVIL,MOTRIN) 200 MG tablet Take 800 mg by mouth 2 (two) times daily as needed. For joint pain       No results found for this or any previous visit (from the past 48 hour(s)). No results found.  ROS  Blood pressure 132/61, pulse 51, temperature 98.1 F (36.7 C), temperature source Oral, resp. rate 14, SpO2 97 %. Physical Exam  Constitutional: He appears well-developed and well-nourished.  HENT:  Mouth/Throat: Oropharynx is clear and moist.  Eyes: Conjunctivae are normal. No scleral icterus.  Neck: No thyromegaly present.  Cardiovascular: Normal rate, regular rhythm and normal heart sounds.   No murmur heard. GI: Soft. He exhibits no distension and no mass. There is no tenderness.  Musculoskeletal: He exhibits no edema.  Lymphadenopathy:    He has no cervical adenopathy.  Neurological: He is alert.  Skin: Skin is warm and dry.      Assessment/Plan Chronic GERD complicated by short segment Barrett's esophagus. Surveillance EGD.  REHMAN,NAJEEB U 04/13/2014, 1:52 PM

## 2014-04-14 ENCOUNTER — Encounter (HOSPITAL_COMMUNITY): Payer: Self-pay | Admitting: Internal Medicine

## 2014-04-14 ENCOUNTER — Telehealth (INDEPENDENT_AMBULATORY_CARE_PROVIDER_SITE_OTHER): Payer: Self-pay | Admitting: Internal Medicine

## 2014-04-14 DIAGNOSIS — K219 Gastro-esophageal reflux disease without esophagitis: Secondary | ICD-10-CM

## 2014-04-14 DIAGNOSIS — K2270 Barrett's esophagus without dysplasia: Secondary | ICD-10-CM

## 2014-04-14 MED ORDER — OMEPRAZOLE 40 MG PO CPDR
40.0000 mg | DELAYED_RELEASE_CAPSULE | Freq: Every day | ORAL | Status: DC
Start: 2014-04-14 — End: 2015-12-04

## 2014-04-14 NOTE — Telephone Encounter (Signed)
Rx for Omeprazole sent to his pharmacy. Insurance required a prior [employer]

## 2014-04-18 ENCOUNTER — Telehealth (INDEPENDENT_AMBULATORY_CARE_PROVIDER_SITE_OTHER): Payer: Self-pay | Admitting: *Deleted

## 2014-04-18 NOTE — Telephone Encounter (Signed)
04/17/2014 4:44 pm  Left message stating he missed call from our office  He is certain about his results

## 2014-04-20 ENCOUNTER — Telehealth (INDEPENDENT_AMBULATORY_CARE_PROVIDER_SITE_OTHER): Payer: Self-pay | Admitting: *Deleted

## 2014-04-20 NOTE — Telephone Encounter (Signed)
Reviewed with Dr.Rehman. He states that he is going to call the patient.

## 2014-04-20 NOTE — Telephone Encounter (Signed)
Patient's Insurance [employer] [place] was called to obtain a PA on Advanced Micro DevicesDexilant. Per the representative, the patient will need to try the following all 4 prior to a PA being done for Dexilant. They are, Prilosec , Protonix, Prevacid, Aciphex. This information will be reviewed with the provider.

## 2014-04-20 NOTE — Telephone Encounter (Signed)
Per patient's insurance he will have to try all 4 of these PPI 's before a PA can be done: Prilosec, Protonix,Prevacid, Aciphex. This is before a PA can be done for the Dexilant 60 mg. Per Delrae Renderri Setzer,NP - may call in Protonix 40 mg - Take 1 by mouth daily #30 5 refills. This was called to the [facility]. A message was left on the patient's cell phone voice mail with this information.

## 2014-04-25 ENCOUNTER — Encounter (INDEPENDENT_AMBULATORY_CARE_PROVIDER_SITE_OTHER): Payer: Self-pay | Admitting: *Deleted

## 2014-11-30 ENCOUNTER — Encounter (INDEPENDENT_AMBULATORY_CARE_PROVIDER_SITE_OTHER): Payer: Self-pay | Admitting: *Deleted

## 2014-12-18 ENCOUNTER — Encounter (INDEPENDENT_AMBULATORY_CARE_PROVIDER_SITE_OTHER): Payer: Self-pay | Admitting: *Deleted

## 2014-12-18 ENCOUNTER — Ambulatory Visit (INDEPENDENT_AMBULATORY_CARE_PROVIDER_SITE_OTHER): Payer: BLUE CROSS/BLUE SHIELD | Admitting: Internal Medicine

## 2015-02-18 ENCOUNTER — Other Ambulatory Visit (INDEPENDENT_AMBULATORY_CARE_PROVIDER_SITE_OTHER): Payer: Self-pay | Admitting: Internal Medicine

## 2015-05-15 DIAGNOSIS — Z0000 Encounter for general adult medical examination without abnormal findings: Secondary | ICD-10-CM | POA: Diagnosis not present

## 2015-05-15 DIAGNOSIS — N500 Atrophy of testis: Secondary | ICD-10-CM | POA: Diagnosis not present

## 2015-05-15 DIAGNOSIS — Z3009 Encounter for other general counseling and advice on contraception: Secondary | ICD-10-CM | POA: Diagnosis not present

## 2015-12-04 ENCOUNTER — Ambulatory Visit (INDEPENDENT_AMBULATORY_CARE_PROVIDER_SITE_OTHER): Payer: BLUE CROSS/BLUE SHIELD | Admitting: Physician Assistant

## 2015-12-04 VITALS — BP 118/80 | HR 62 | Temp 98.2°F | Resp 16 | Ht 68.0 in | Wt 193.0 lb

## 2015-12-04 DIAGNOSIS — Z130 Encounter for screening for diseases of the blood and blood-forming organs and certain disorders involving the immune mechanism: Secondary | ICD-10-CM

## 2015-12-04 DIAGNOSIS — Z0000 Encounter for general adult medical examination without abnormal findings: Secondary | ICD-10-CM

## 2015-12-04 DIAGNOSIS — Z114 Encounter for screening for human immunodeficiency virus [HIV]: Secondary | ICD-10-CM | POA: Diagnosis not present

## 2015-12-04 DIAGNOSIS — Z23 Encounter for immunization: Secondary | ICD-10-CM

## 2015-12-04 DIAGNOSIS — Z13228 Encounter for screening for other metabolic disorders: Secondary | ICD-10-CM

## 2015-12-04 DIAGNOSIS — Z1329 Encounter for screening for other suspected endocrine disorder: Secondary | ICD-10-CM | POA: Diagnosis not present

## 2015-12-04 LAB — CBC WITH DIFFERENTIAL/PLATELET
BASOS PCT: 0 %
Basophils Absolute: 0 cells/uL (ref 0–200)
EOS ABS: 156 {cells}/uL (ref 15–500)
Eosinophils Relative: 2 %
HEMATOCRIT: 42 % (ref 38.5–50.0)
Hemoglobin: 13.9 g/dL (ref 13.2–17.1)
Lymphocytes Relative: 15 %
Lymphs Abs: 1170 cells/uL (ref 850–3900)
MCH: 28.7 pg (ref 27.0–33.0)
MCHC: 33.1 g/dL (ref 32.0–36.0)
MCV: 86.8 fL (ref 80.0–100.0)
MONO ABS: 390 {cells}/uL (ref 200–950)
MPV: 9.6 fL (ref 7.5–12.5)
Monocytes Relative: 5 %
NEUTROS ABS: 6084 {cells}/uL (ref 1500–7800)
Neutrophils Relative %: 78 %
PLATELETS: 280 10*3/uL (ref 140–400)
RBC: 4.84 MIL/uL (ref 4.20–5.80)
RDW: 13.4 % (ref 11.0–15.0)
WBC: 7.8 10*3/uL (ref 3.8–10.8)

## 2015-12-04 LAB — COMPLETE METABOLIC PANEL WITH GFR
ALT: 10 U/L (ref 9–46)
AST: 22 U/L (ref 10–40)
Albumin: 4.5 g/dL (ref 3.6–5.1)
Alkaline Phosphatase: 69 U/L (ref 40–115)
BUN: 14 mg/dL (ref 7–25)
CHLORIDE: 103 mmol/L (ref 98–110)
CO2: 29 mmol/L (ref 20–31)
Calcium: 9.5 mg/dL (ref 8.6–10.3)
Creat: 0.96 mg/dL (ref 0.60–1.35)
Glucose, Bld: 78 mg/dL (ref 65–99)
Potassium: 4.6 mmol/L (ref 3.5–5.3)
Sodium: 140 mmol/L (ref 135–146)
Total Bilirubin: 0.9 mg/dL (ref 0.2–1.2)
Total Protein: 6.6 g/dL (ref 6.1–8.1)

## 2015-12-04 LAB — TSH: TSH: 1.4 mIU/L (ref 0.40–4.50)

## 2015-12-04 LAB — HIV ANTIBODY (ROUTINE TESTING W REFLEX): HIV 1&2 Ab, 4th Generation: NONREACTIVE

## 2015-12-04 LAB — LIPID PANEL
Cholesterol: 144 mg/dL (ref <200)
HDL: 60 mg/dL (ref >40)
LDL CALC: 74 mg/dL (ref <100)
TRIGLYCERIDES: 49 mg/dL (ref <150)
Total CHOL/HDL Ratio: 2.4 Ratio (ref <5.0)
VLDL: 10 mg/dL (ref <30)

## 2015-12-04 NOTE — Progress Notes (Signed)
Charles ChannelJonathan Q Winiecki  MRN: [id number] DOB: 1980-10-15  Subjective:  Pt  is a 35 y.o. male who presents for annual physical exam.  Social: [place] 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 He is sexually active with his wife. He has two kids, ages 508 yo and 776 yo.   Diet: He eats protein powder, yogurt, boneless baked chicken, mixed vegetables, and brown rice pretty much every day. Stays away from all processed foods. He uses a fitness pal app. He fasts from 7pm til after his morning workout. His calorie intake is 1770 daily. He has lost 90 lbs since 05/2015.   Exercise: He is doing one hour crossfit class once a day for five days a week.   Sleep: He only sleeps about 5-6 hours a night but he feels rested throughout the day and has always been this.  Last dental exam: 05/2015 Last vision exam: Cannot remember  Vaccinations      Tetanus: Cannot remember, but it has been over 10 years.       Flu vaccine: He has never gotten the flu vaccine and does not want it today  Patient Active Problem List   Diagnosis Date Noted  . GERD (gastroesophageal reflux disease) 03/13/2011  . Barrett esophagus 03/13/2011  . Barrett's esophagus 03/13/2011    No current outpatient prescriptions on file prior to visit.   No current facility-administered medications on file prior to visit.     No Known Allergies  Social History   Social History  . Marital status: Married    Spouse name: N/A  . Number of children: 2  . Years of education: N/A   Occupational History  . Maintenance manager [employer]   Social History Main Topics  . Smoking status: Former Smoker    Packs/day: 1.50    Years: 15.00    Quit date: 09/28/2012  . Smokeless tobacco: Never Used  . Alcohol use No  . Drug use: No  . Sexual activity: Not Asked   Other Topics Concern  . None   Social History Narrative  . None    Past Surgical History:  Procedure Laterality Date  .  ESOPHAGOGASTRODUODENOSCOPY N/A 04/13/2014   Procedure: ESOPHAGOGASTRODUODENOSCOPY (EGD);  Surgeon: Malissa HippoNajeeb U Rehman, MD;  Location: [facility];  Service: Endoscopy;  Laterality: N/A;  1200 - moved to 3/31 @ 1:00  . WISDOM TOOTH EXTRACTION      Family History  Problem Relation Age of Onset  . Diabetes Father   . Hyperlipidemia Father   . Hypertension Father   . Healthy Daughter     Review of Systems  Constitutional: Negative.   HENT: Negative.   Eyes: Negative.   Respiratory: Negative.   Cardiovascular: Negative.   Gastrointestinal: Negative.   Endocrine: Negative.   Genitourinary: Negative.   Musculoskeletal: Negative.   Skin: Negative.   Allergic/Immunologic: Negative.   Neurological: Negative.   Hematological: Negative.   Psychiatric/Behavioral: Negative.     Objective:  BP 118/80 (BP Location: Right Arm, Patient Position: Sitting, Cuff Size: Normal)   Pulse 62   Temp 98.2 F (36.8 C) (Oral)   Resp 16   Ht 5\' 8"  (1.727 m)   Wt 193 lb (87.5 kg)   SpO2 99%   BMI 29.35 kg/m   Physical Exam  Constitutional: He is oriented to person, place, and time and well-developed, well-nourished, and in no distress.  HENT:  Head: Normocephalic and atraumatic.  Right Ear: Hearing, tympanic membrane, external  ear and ear canal normal.  Left Ear: Hearing, tympanic membrane, external ear and ear canal normal.  Nose: Nose normal.  Mouth/Throat: Uvula is midline, oropharynx is clear and moist and mucous membranes are normal. No oropharyngeal exudate.  Eyes: Conjunctivae and EOM are normal. Pupils are equal, round, and reactive to light.  Neck: Trachea normal and normal range of motion.  Cardiovascular: Normal rate, regular rhythm, normal heart sounds and intact distal pulses.   Pulmonary/Chest: Effort normal and breath sounds normal.  Abdominal: Soft. Normal appearance and bowel sounds are normal.  Musculoskeletal: Normal range of motion.  Lymphadenopathy:       Head (right side):  No submental, no submandibular, no tonsillar, no preauricular, no posterior auricular and no occipital adenopathy present.       Head (left side): No submental, no submandibular, no tonsillar, no preauricular, no posterior auricular and no occipital adenopathy present.    He has no cervical adenopathy.       Right: No supraclavicular adenopathy present.       Left: No supraclavicular adenopathy present.  Neurological: He is alert and oriented to person, place, and time. He has normal sensation, normal strength and normal reflexes. Gait normal.  Skin: Skin is warm and dry.  Psychiatric: Affect normal.  Vitals reviewed.     Visual Acuity Screening   Right eye Left eye Both eyes  Without correction: 20/20 20/25 20/15   With correction:      BP Readings from Last 3 Encounters:  12/04/15 118/80  04/13/14 113/72  02/15/14 130/70   Wt Readings from Last 3 Encounters:  12/04/15 193 lb (87.5 kg)  02/15/14 250 lb (113.4 kg)  02/15/13 269 lb 8 oz (122.2 kg)     Assessment and Plan :  Discussed healthy lifestyle, diet, exercise, preventative care, vaccinations, and addressed patient's concerns. Plan for follow up in one year. Otherwise, plan for specific conditions below.  1. Annual physical exam Await results  2. Screening for metabolic disorder - Lipid panel - COMPLETE METABOLIC PANEL WITH GFR  3. Screening, anemia, deficiency, iron - CBC with Differential/Platelet  4. Screening for HIV (human immunodeficiency virus) - HIV antibody  5. Screening for thyroid disorder - TSH  6. Need for tetanus booster - Td vaccine greater than or equal to 7yo preservative free IM   Benjiman CoreBrittany Miriya Cloer PA-C  [facility] 12/04/2015 9:16 AM

## 2015-12-04 NOTE — Patient Instructions (Signed)
Keep up the great work! We will contact you with your lab results. I will fax over your form for work. Thank you for letting me participate in your health and well being.       IF you received an x-ray today, you will receive an invoice from [facility]. Please contact [facility] at [phone] with questions or concerns regarding your invoice.   IF you received labwork today, you will receive an invoice from [facility]. Please contact [facility] at [phone] with questions or concerns regarding your invoice.   Our billing staff will not be able to assist you with questions regarding bills from these companies.  You will be contacted with the lab results as soon as they are available. The fastest way to get your results is to activate your My Chart account. Instructions are located on the last page of this paperwork. If you have not heard from us regarding the results in 2 weeks, please contact this office.

## 2015-12-05 ENCOUNTER — Ambulatory Visit: Payer: Self-pay

## 2015-12-10 ENCOUNTER — Telehealth: Payer: Self-pay

## 2015-12-10 NOTE — Telephone Encounter (Signed)
Pt is calling and wants to know whether or not you have faxed over his wellness form to [facility] it was a physicians form they have not received it yet and he wanted to follow up with you on that   Please advise [phone]

## 2015-12-11 NOTE — Telephone Encounter (Signed)
I completed the form and put it in our "to be faxed" box in the provider lounge last week. Please let me know who we should ask to see if this was faxed. I can refill a sheet out if it got lost. Thanks!

## 2015-12-11 NOTE — Telephone Encounter (Signed)
Normally once they are faxed and go through that is the last tracking we have on them if you still have the form and could fill out another one I can try to fax again and call the pt and let him know I have refaxed it

## 2015-12-12 NOTE — Telephone Encounter (Signed)
Thank you. Let me know if you do not find it.

## 2016-12-01 ENCOUNTER — Other Ambulatory Visit: Payer: Self-pay

## 2016-12-01 ENCOUNTER — Encounter: Payer: Self-pay | Admitting: Physician Assistant

## 2016-12-01 ENCOUNTER — Ambulatory Visit (INDEPENDENT_AMBULATORY_CARE_PROVIDER_SITE_OTHER): Payer: BLUE CROSS/BLUE SHIELD | Admitting: Physician Assistant

## 2016-12-01 VITALS — BP 110/80 | HR 64 | Temp 98.8°F | Resp 16 | Ht 68.25 in | Wt 176.4 lb

## 2016-12-01 DIAGNOSIS — Z1329 Encounter for screening for other suspected endocrine disorder: Secondary | ICD-10-CM

## 2016-12-01 DIAGNOSIS — Z13228 Encounter for screening for other metabolic disorders: Secondary | ICD-10-CM | POA: Diagnosis not present

## 2016-12-01 DIAGNOSIS — Z1389 Encounter for screening for other disorder: Secondary | ICD-10-CM

## 2016-12-01 DIAGNOSIS — Z130 Encounter for screening for diseases of the blood and blood-forming organs and certain disorders involving the immune mechanism: Secondary | ICD-10-CM

## 2016-12-01 DIAGNOSIS — Z0000 Encounter for general adult medical examination without abnormal findings: Secondary | ICD-10-CM

## 2016-12-01 DIAGNOSIS — Z13220 Encounter for screening for lipoid disorders: Secondary | ICD-10-CM

## 2016-12-01 DIAGNOSIS — H6121 Impacted cerumen, right ear: Secondary | ICD-10-CM

## 2016-12-01 LAB — POCT URINALYSIS DIP (MANUAL ENTRY)
BILIRUBIN UA: NEGATIVE mg/dL
Bilirubin, UA: NEGATIVE
Blood, UA: NEGATIVE
Glucose, UA: NEGATIVE mg/dL
Leukocytes, UA: NEGATIVE
Nitrite, UA: NEGATIVE
PROTEIN UA: NEGATIVE mg/dL
Spec Grav, UA: 1.02 (ref 1.010–1.025)
Urobilinogen, UA: 0.2 E.U./dL
pH, UA: 7 (ref 5.0–8.0)

## 2016-12-01 NOTE — Progress Notes (Signed)
Charles Vincent  MRN: [id number] DOB: [birth year]  Subjective:  Pt is a 36 y.o. male who presents for annual physical exam.  Last dental exam: within 6 months Last vision exam: cannot remember   Vaccinations      Tetanus: 12/04/2015        Patient Active Problem List   Diagnosis Date Noted  . GERD (gastroesophageal reflux disease) 03/13/2011  . Barrett esophagus 03/13/2011  . Barrett's esophagus 03/13/2011    No current outpatient medications on file prior to visit.   No current facility-administered medications on file prior to visit.     No Known Allergies  Social History   Socioeconomic History  . Marital status: Married    Spouse name: None  . Number of children: 2  . Years of education: college  . Highest education level: Associate degree: occupational, Hotel manager, or vocational program  Social Needs  . Financial resource strain: Not hard at all  . Food insecurity - worry: Never true  . Food insecurity - inability: Never true  . Transportation needs - medical: No  . Transportation needs - non-medical: No  Occupational History  . Occupation: Chartered certified accountant: [employer]  Tobacco Use  . Smoking status: Former Smoker    Packs/day: 1.50    Years: 15.00    Pack years: 22.50    Last attempt to quit: 09/28/2012    Years since quitting: 4.1  . Smokeless tobacco: Never Used  Substance and Sexual Activity  . Alcohol use: No  . Drug use: No  . Sexual activity: Yes    Partners: Female    Birth control/protection: Pill    Comment: with monogamous partner  Other Topics Concern  . None  Social History Narrative   Exercise: Patient exercises 6 days a week for 90 minutes at a time.  He does CrossFit.  He stretches before and after exercise.  Exercise.      Diet: Eats only clean whole foods. Drinks about the water, black coffee, and unsweet tea.       Lost 110 lbs in 8 months in 2016-2017. He is actually wanting to gain weight at this  time.     Past Surgical History:  Procedure Laterality Date  . ESOPHAGOGASTRODUODENOSCOPY (EGD) N/A 04/13/2014   Performed by Rogene Houston, MD at [facility]  . WISDOM TOOTH EXTRACTION      Family History  Problem Relation Age of Onset  . Hypertension Mother   . Diabetes Father   . Hyperlipidemia Father   . Hypertension Father   . Healthy Daughter     Review of Systems  Constitutional: Negative for activity change, appetite change, chills, diaphoresis, fatigue, fever and unexpected weight change.  HENT: Negative for congestion, dental problem, drooling, ear discharge, ear pain, facial swelling, hearing loss, mouth sores, nosebleeds, postnasal drip, rhinorrhea, sinus pressure, sinus pain, sneezing, sore throat, tinnitus, trouble swallowing and voice change.   Eyes: Negative for photophobia, pain, discharge, redness, itching and visual disturbance.  Respiratory: Negative for apnea, cough, choking, chest tightness, shortness of breath, wheezing and stridor.   Cardiovascular: Negative for chest pain, palpitations and leg swelling.  Gastrointestinal: Negative for abdominal distention, abdominal pain, anal bleeding, blood in stool, constipation, diarrhea, nausea, rectal pain and vomiting.  Endocrine: Negative for cold intolerance, heat intolerance, polydipsia, polyphagia and polyuria.  Genitourinary: Negative for decreased urine volume, difficulty urinating, discharge, dysuria, enuresis, flank pain, frequency, genital sores, hematuria, penile pain, penile swelling, scrotal swelling, testicular  pain and urgency.  Musculoskeletal: Negative for arthralgias, back pain, gait problem, joint swelling, myalgias, neck pain and neck stiffness.  Skin: Negative for color change, pallor, rash and wound.  Allergic/Immunologic: Negative for environmental allergies, food allergies and immunocompromised state.  Neurological: Negative for dizziness, tremors, seizures, syncope, facial asymmetry, speech  difficulty, weakness, light-headedness, numbness and headaches.  Hematological: Negative for adenopathy. Does not bruise/bleed easily.  Psychiatric/Behavioral: Negative for agitation, behavioral problems, confusion, decreased concentration, dysphoric mood, hallucinations, self-injury, sleep disturbance and suicidal ideas. The patient is not nervous/anxious and is not hyperactive.     Objective:  BP 110/80 (BP Location: Left Arm, Patient Position: Sitting, Cuff Size: Normal)   Pulse 64   Temp 98.8 F (37.1 C) (Oral)   Resp 16   Ht 5' 8.25" (1.734 m)   Wt 176 lb 6.4 oz (80 kg)   SpO2 98%   BMI 26.63 kg/m   Physical Exam  Constitutional: He is oriented to person, place, and time and well-developed, well-nourished, and in no distress.  HENT:  Head: Normocephalic and atraumatic.  Right Ear: Hearing and ear canal normal. There is drainage (dark brown cerumen blocking view of TM).  Left Ear: Hearing, tympanic membrane, external ear and ear canal normal.  Nose: Nose normal.  Mouth/Throat: Uvula is midline, oropharynx is clear and moist and mucous membranes are normal. No oropharyngeal exudate.  Eyes: Conjunctivae and EOM are normal. Pupils are equal, round, and reactive to light.  Neck: Trachea normal and normal range of motion.  Cardiovascular: Normal rate, regular rhythm, normal heart sounds and intact distal pulses.  Pulmonary/Chest: Effort normal and breath sounds normal.  Abdominal: Soft. Normal appearance and bowel sounds are normal.  Musculoskeletal: Normal range of motion.  Lymphadenopathy:       Head (right side): No submental, no submandibular, no tonsillar, no preauricular, no posterior auricular and no occipital adenopathy present.       Head (left side): No submental, no submandibular, no tonsillar, no preauricular, no posterior auricular and no occipital adenopathy present.    He has no cervical adenopathy.       Right: No supraclavicular adenopathy present.       Left: No  supraclavicular adenopathy present.  Neurological: He is alert and oriented to person, place, and time. He has normal sensation, normal strength and normal reflexes. Gait normal.  Skin: Skin is warm and dry.  Psychiatric: Affect normal.  Vitals reviewed.   Visual Acuity Screening   Right eye Left eye Both eyes  Without correction: _0  With correction:      Results for orders placed or performed in visit on 12/01/16 (from the past 24 hour(s))  POCT urinalysis dipstick     Status: None   Collection Time: 12/01/16  9:17 AM  Result Value Ref Range   Color, UA yellow yellow   Clarity, UA clear clear   Glucose, UA negative negative mg/dL   Bilirubin, UA negative negative   Ketones, POC UA negative negative mg/dL   Spec Grav, UA 1.020 1.010 - 1.025   Blood, UA negative negative   pH, UA 7.0 5.0 - 8.0   Protein Ur, POC negative negative mg/dL   Urobilinogen, UA 0.2 0.2 or 1.0 E.U./dL   Nitrite, UA Negative Negative   Leukocytes, UA Negative Negative     Assessment and Plan :  Discussed healthy lifestyle, diet, exercise, preventative care, vaccinations, and addressed patient's concerns. Plan for follow up in one year. Otherwise, plan for specific conditions below.  1. Annual physical exam Await lab results.  I have placed his paperwork for his job in my box and will complete once his labs have resulted.  He needs his form completed by 12/12/16.  2. Sreening, anemia, deficiency, iron - CBC with Differential/Platelet  3. Screening for metabolic disorder - TIR44+RXVQ  4. Screening, lipid - Lipid panel  5. Screening for thyroid disorder - TSH  6. Screening for hematuria or proteinuria - POCT urinalysis dipstick  7. Impacted cerumen of right ear Resolved in office after earwax removal. - Ear wax removal  Tenna Delaine, PA-C  [facility] [place] 12/01/2016 10:02 AM

## 2016-12-01 NOTE — Patient Instructions (Addendum)
Keep up the great work! We will complete your paperwork once we have your results and contact you that we have done so. See you next year!   Health Maintenance, Male A healthy lifestyle and preventive care is important for your health and wellness. Ask your health care provider about what schedule of regular examinations is right for you. What should I know about weight and diet? Eat a Healthy Diet  Eat plenty of vegetables, fruits, whole grains, low-fat dairy products, and lean protein.  Do not eat a lot of foods high in solid fats, added sugars, or salt.  Maintain a Healthy Weight Regular exercise can help you achieve or maintain a healthy weight. You should:  Do at least 150 minutes of exercise each week. The exercise should increase your heart rate and make you sweat (moderate-intensity exercise).  Do strength-training exercises at least twice a week.  Watch Your Levels of Cholesterol and Blood Lipids  Have your blood tested for lipids and cholesterol every 5 years starting at 38 years of age. If you are at high risk for heart disease, you should start having your blood tested when you are 11033 years old. You may need to have your cholesterol levels checked more often if: ? Your lipid or cholesterol levels are high. ? You are older than 38 years of age. ? You are at high risk for heart disease.  What should I know about cancer screening? Many types of cancers can be detected early and may often be prevented. Lung Cancer  You should be screened every year for lung cancer if: ? You are a current smoker who has smoked for at least 30 years. ? You are a former smoker who has quit within the past 15 years.  Talk to your health care provider about your screening options, when you should start screening, and how often you should be screened.  Colorectal Cancer  Routine colorectal cancer screening usually begins at 38 years of age and should be repeated every 5-10 years until you are  38 years old. You may need to be screened more often if early forms of precancerous polyps or small growths are found. Your health care provider may recommend screening at an earlier age if you have risk factors for colon cancer.  Your health care provider may recommend using home test kits to check for hidden blood in the stool.  A small camera at the end of a tube can be used to examine your colon (sigmoidoscopy or colonoscopy). This checks for the earliest forms of colorectal cancer.  Prostate and Testicular Cancer  Depending on your age and overall health, your health care provider may do certain tests to screen for prostate and testicular cancer.  Talk to your health care provider about any symptoms or concerns you have about testicular or prostate cancer.  Skin Cancer  Check your skin from head to toe regularly.  Tell your health care provider about any new moles or changes in moles, especially if: ? There is a change in a mole's size, shape, or color. ? You have a mole that is larger than a pencil eraser.  Always use sunscreen. Apply sunscreen liberally and repeat throughout the day.  Protect yourself by wearing long sleeves, pants, a wide-brimmed hat, and sunglasses when outside.  What should I know about heart disease, diabetes, and high blood pressure?  If you are 8318-38 years of age, have your blood pressure checked every 3-5 years. If you are 40 years of  age or older, have your blood pressure checked every year. You should have your blood pressure measured twice-once when you are at a hospital or clinic, and once when you are not at a hospital or clinic. Record the average of the two measurements. To check your blood pressure when you are not at a hospital or clinic, you can use: ? An automated blood pressure machine at a pharmacy. ? A home blood pressure monitor.  Talk to your health care provider about your target blood pressure.  If you are between 2045-38 years old, ask  your health care provider if you should take aspirin to prevent heart disease.  Have regular diabetes screenings by checking your fasting blood sugar level. ? If you are at a normal weight and have a low risk for diabetes, have this test once every three years after the age of 38. ? If you are overweight and have a high risk for diabetes, consider being tested at a younger age or more often.  A one-time screening for abdominal aortic aneurysm (AAA) by ultrasound is recommended for men aged 65-75 years who are current or former smokers. What should I know about preventing infection? Hepatitis B If you have a higher risk for hepatitis B, you should be screened for this virus. Talk with your health care provider to find out if you are at risk for hepatitis B infection. Hepatitis C Blood testing is recommended for:  Everyone born from [id number] through 1965.  Anyone with known risk factors for hepatitis C.  Sexually Transmitted Diseases (STDs)  You should be screened each year for STDs including gonorrhea and chlamydia if: ? You are sexually active and are younger than 38 years of age. ? You are older than 38 years of age and your health care provider tells you that you are at risk for this type of infection. ? Your sexual activity has changed since you were last screened and you are at an increased risk for chlamydia or gonorrhea. Ask your health care provider if you are at risk.  Talk with your health care provider about whether you are at high risk of being infected with HIV. Your health care provider may recommend a prescription medicine to help prevent HIV infection.  What else can I do?  Schedule regular health, dental, and eye exams.  Stay current with your vaccines (immunizations).  Do not use any tobacco products, such as cigarettes, chewing tobacco, and e-cigarettes. If you need help quitting, ask your health care provider.  Limit alcohol intake to no more than 2 drinks per day.  One drink equals 12 ounces of beer, 5 ounces of wine, or 1 ounces of hard liquor.  Do not use street drugs.  Do not share needles.  Ask your health care provider for help if you need support or information about quitting drugs.  Tell your health care provider if you often feel depressed.  Tell your health care provider if you have ever been abused or do not feel safe at home. This information is not intended to replace advice given to you by your health care provider. Make sure you discuss any questions you have with your health care provider. Document Released: 06/28/2007 Document Revised: 08/29/2015 Document Reviewed: 10/03/2014 Elsevier Interactive Patient Education  2018 [employer]     IF you received an x-ray today, you will receive an invoice from [facility]. Please contact [facility] at [phone] with questions or concerns regarding your invoice.   IF you received  labwork today, you will receive an invoice from [facility]. Please contact [facility] at [phone] with questions or concerns regarding your invoice.   Our billing staff will not be able to assist you with questions regarding bills from these companies.  You will be contacted with the lab results as soon as they are available. The fastest way to get your results is to activate your My Chart account. Instructions are located on the last page of this paperwork. If you have not heard from [place] regarding the results in 2 weeks, please contact this office.

## 2016-12-02 LAB — CBC WITH DIFFERENTIAL/PLATELET
BASOS ABS: 0 10*3/uL (ref 0.0–0.2)
Basos: 1 %
EOS (ABSOLUTE): 0.2 10*3/uL (ref 0.0–0.4)
Eos: 4 %
HEMOGLOBIN: 13.8 g/dL (ref 13.0–17.7)
Hematocrit: 40 % (ref 37.5–51.0)
Immature Grans (Abs): 0 10*3/uL (ref 0.0–0.1)
Immature Granulocytes: 0 %
LYMPHS ABS: 1 10*3/uL (ref 0.7–3.1)
Lymphs: 18 %
MCH: 30.1 pg (ref 26.6–33.0)
MCHC: 34.5 g/dL (ref 31.5–35.7)
MCV: 87 fL (ref 79–97)
Monocytes Absolute: 0.2 10*3/uL (ref 0.1–0.9)
Monocytes: 4 %
Neutrophils Absolute: 4.2 10*3/uL (ref 1.4–7.0)
Neutrophils: 73 %
Platelets: 237 10*3/uL (ref 150–379)
RBC: 4.58 x10E6/uL (ref 4.14–5.80)
RDW: 13.3 % (ref 12.3–15.4)
WBC: 5.7 10*3/uL (ref 3.4–10.8)

## 2016-12-02 LAB — CMP14+EGFR
ALK PHOS: 70 IU/L (ref 39–117)
ALT: 29 IU/L (ref 0–44)
AST: 29 IU/L (ref 0–40)
Albumin/Globulin Ratio: 2.4 — ABNORMAL HIGH (ref 1.2–2.2)
Albumin: 4.5 g/dL (ref 3.5–5.5)
BILIRUBIN TOTAL: 0.6 mg/dL (ref 0.0–1.2)
BUN / CREAT RATIO: 23 — AB (ref 9–20)
BUN: 20 mg/dL (ref 6–20)
CHLORIDE: 103 mmol/L (ref 96–106)
CO2: 26 mmol/L (ref 20–29)
Calcium: 9.3 mg/dL (ref 8.7–10.2)
Creatinine, Ser: 0.87 mg/dL (ref 0.76–1.27)
GFR calc Af Amer: 127 mL/min/{1.73_m2} (ref >59)
GFR calc non Af Amer: 109 mL/min/{1.73_m2} (ref >59)
GLUCOSE: 92 mg/dL (ref 65–99)
Globulin, Total: 1.9 g/dL (ref 1.5–4.5)
Potassium: 4.2 mmol/L (ref 3.5–5.2)
Sodium: 142 mmol/L (ref 134–144)
Total Protein: 6.4 g/dL (ref 6.0–8.5)

## 2016-12-02 LAB — LIPID PANEL
Chol/HDL Ratio: 2.2 ratio (ref 0.0–5.0)
Cholesterol, Total: 132 mg/dL (ref 100–199)
HDL: 60 mg/dL (ref >39)
LDL Calculated: 66 mg/dL (ref 0–99)
Triglycerides: 32 mg/dL (ref 0–149)
VLDL CHOLESTEROL CAL: 6 mg/dL (ref 5–40)

## 2016-12-02 LAB — TSH: TSH: 2.68 u[IU]/mL (ref 0.450–4.500)

## 2016-12-08 ENCOUNTER — Telehealth: Payer: Self-pay

## 2016-12-08 NOTE — Telephone Encounter (Signed)
Copied from CRM [id number]. Topic: Quick Communication - Office Called Patient >> Dec 03, 2016  2:10 PM Maryfrances BunnellBlackwell, Jalexis Breed N, [place] wrote: Reason for CRM: I called pt to let him know that I faxed his 2018 [employer] Physician form. Pt states thanks and he would like for me to mail the from to his home.( Sent via mail today)

## 2016-12-09 ENCOUNTER — Encounter: Payer: Self-pay | Admitting: *Deleted

## 2017-01-14 DIAGNOSIS — J019 Acute sinusitis, unspecified: Secondary | ICD-10-CM | POA: Diagnosis not present

## 2017-07-10 DIAGNOSIS — Z6833 Body mass index (BMI) 33.0-33.9, adult: Secondary | ICD-10-CM | POA: Diagnosis not present

## 2017-07-10 DIAGNOSIS — K219 Gastro-esophageal reflux disease without esophagitis: Secondary | ICD-10-CM | POA: Diagnosis not present

## 2017-08-31 DIAGNOSIS — Z0000 Encounter for general adult medical examination without abnormal findings: Secondary | ICD-10-CM | POA: Diagnosis not present

## 2017-09-04 DIAGNOSIS — K219 Gastro-esophageal reflux disease without esophagitis: Secondary | ICD-10-CM | POA: Diagnosis not present

## 2017-09-04 DIAGNOSIS — Z0001 Encounter for general adult medical examination with abnormal findings: Secondary | ICD-10-CM | POA: Diagnosis not present

## 2017-09-04 DIAGNOSIS — Z6832 Body mass index (BMI) 32.0-32.9, adult: Secondary | ICD-10-CM | POA: Diagnosis not present

## 2017-09-04 DIAGNOSIS — I6523 Occlusion and stenosis of bilateral carotid arteries: Secondary | ICD-10-CM | POA: Diagnosis not present

## 2017-09-09 ENCOUNTER — Other Ambulatory Visit (HOSPITAL_COMMUNITY): Payer: Self-pay | Admitting: Adult Health Nurse Practitioner

## 2017-09-09 DIAGNOSIS — R0989 Other specified symptoms and signs involving the circulatory and respiratory systems: Secondary | ICD-10-CM

## 2017-09-17 ENCOUNTER — Ambulatory Visit (HOSPITAL_COMMUNITY)
Admission: RE | Admit: 2017-09-17 | Discharge: 2017-09-17 | Disposition: A | Discharge status: Home / Self Care | Payer: BLUE CROSS/BLUE SHIELD | Source: Ambulatory Visit | Attending: Adult Health Nurse Practitioner | Admitting: Adult Health Nurse Practitioner

## 2017-09-17 DIAGNOSIS — R0989 Other specified symptoms and signs involving the circulatory and respiratory systems: Secondary | ICD-10-CM | POA: Insufficient documentation

## 2018-09-03 DIAGNOSIS — K219 Gastro-esophageal reflux disease without esophagitis: Secondary | ICD-10-CM | POA: Diagnosis not present

## 2018-09-03 DIAGNOSIS — Z6832 Body mass index (BMI) 32.0-32.9, adult: Secondary | ICD-10-CM | POA: Diagnosis not present

## 2018-09-03 DIAGNOSIS — Z6833 Body mass index (BMI) 33.0-33.9, adult: Secondary | ICD-10-CM | POA: Diagnosis not present

## 2018-09-03 DIAGNOSIS — Z0001 Encounter for general adult medical examination with abnormal findings: Secondary | ICD-10-CM | POA: Diagnosis not present

## 2018-09-03 DIAGNOSIS — I6523 Occlusion and stenosis of bilateral carotid arteries: Secondary | ICD-10-CM | POA: Diagnosis not present

## 2018-09-10 DIAGNOSIS — M25562 Pain in left knee: Secondary | ICD-10-CM | POA: Diagnosis not present

## 2018-09-10 DIAGNOSIS — Z0001 Encounter for general adult medical examination with abnormal findings: Secondary | ICD-10-CM | POA: Diagnosis not present

## 2018-09-10 DIAGNOSIS — K219 Gastro-esophageal reflux disease without esophagitis: Secondary | ICD-10-CM | POA: Diagnosis not present

## 2018-09-10 DIAGNOSIS — R011 Cardiac murmur, unspecified: Secondary | ICD-10-CM | POA: Diagnosis not present

## 2018-09-14 DIAGNOSIS — M25562 Pain in left knee: Secondary | ICD-10-CM | POA: Diagnosis not present

## 2018-09-21 DIAGNOSIS — M25562 Pain in left knee: Secondary | ICD-10-CM | POA: Diagnosis not present

## 2018-09-23 DIAGNOSIS — M25562 Pain in left knee: Secondary | ICD-10-CM | POA: Diagnosis not present

## 2018-09-29 DIAGNOSIS — M1712 Unilateral primary osteoarthritis, left knee: Secondary | ICD-10-CM | POA: Diagnosis not present

## 2018-09-29 DIAGNOSIS — S83232A Complex tear of medial meniscus, current injury, left knee, initial encounter: Secondary | ICD-10-CM | POA: Diagnosis not present

## 2018-09-29 DIAGNOSIS — S83242A Other tear of medial meniscus, current injury, left knee, initial encounter: Secondary | ICD-10-CM | POA: Diagnosis not present

## 2018-09-29 DIAGNOSIS — X58XXXA Exposure to other specified factors, initial encounter: Secondary | ICD-10-CM | POA: Diagnosis not present

## 2018-09-29 DIAGNOSIS — Y999 Unspecified external cause status: Secondary | ICD-10-CM | POA: Diagnosis not present

## 2018-09-29 DIAGNOSIS — M13162 Monoarthritis, not elsewhere classified, left knee: Secondary | ICD-10-CM | POA: Diagnosis not present

## 2018-09-29 DIAGNOSIS — M94262 Chondromalacia, left knee: Secondary | ICD-10-CM | POA: Diagnosis not present

## 2018-10-04 DIAGNOSIS — S8332XD Tear of articular cartilage of left knee, current, subsequent encounter: Secondary | ICD-10-CM | POA: Diagnosis not present

## 2018-10-04 DIAGNOSIS — M2242 Chondromalacia patellae, left knee: Secondary | ICD-10-CM | POA: Diagnosis not present

## 2018-10-04 DIAGNOSIS — M25562 Pain in left knee: Secondary | ICD-10-CM | POA: Diagnosis not present

## 2018-10-04 DIAGNOSIS — M23222 Derangement of posterior horn of medial meniscus due to old tear or injury, left knee: Secondary | ICD-10-CM | POA: Diagnosis not present

## 2018-10-11 DIAGNOSIS — M23222 Derangement of posterior horn of medial meniscus due to old tear or injury, left knee: Secondary | ICD-10-CM | POA: Diagnosis not present

## 2018-10-11 DIAGNOSIS — M2242 Chondromalacia patellae, left knee: Secondary | ICD-10-CM | POA: Diagnosis not present

## 2018-10-11 DIAGNOSIS — S8332XD Tear of articular cartilage of left knee, current, subsequent encounter: Secondary | ICD-10-CM | POA: Diagnosis not present

## 2018-10-11 DIAGNOSIS — M25562 Pain in left knee: Secondary | ICD-10-CM | POA: Diagnosis not present

## 2018-10-25 DIAGNOSIS — S8332XD Tear of articular cartilage of left knee, current, subsequent encounter: Secondary | ICD-10-CM | POA: Diagnosis not present

## 2018-10-25 DIAGNOSIS — M25562 Pain in left knee: Secondary | ICD-10-CM | POA: Diagnosis not present

## 2018-10-25 DIAGNOSIS — M2242 Chondromalacia patellae, left knee: Secondary | ICD-10-CM | POA: Diagnosis not present

## 2018-10-25 DIAGNOSIS — M23222 Derangement of posterior horn of medial meniscus due to old tear or injury, left knee: Secondary | ICD-10-CM | POA: Diagnosis not present

## 2018-11-01 DIAGNOSIS — M6281 Muscle weakness (generalized): Secondary | ICD-10-CM | POA: Diagnosis not present

## 2018-11-01 DIAGNOSIS — M25562 Pain in left knee: Secondary | ICD-10-CM | POA: Diagnosis not present

## 2018-11-01 DIAGNOSIS — M2242 Chondromalacia patellae, left knee: Secondary | ICD-10-CM | POA: Diagnosis not present

## 2018-11-01 DIAGNOSIS — M23222 Derangement of posterior horn of medial meniscus due to old tear or injury, left knee: Secondary | ICD-10-CM | POA: Diagnosis not present

## 2018-11-08 DIAGNOSIS — M25562 Pain in left knee: Secondary | ICD-10-CM | POA: Diagnosis not present

## 2018-11-08 DIAGNOSIS — S8332XD Tear of articular cartilage of left knee, current, subsequent encounter: Secondary | ICD-10-CM | POA: Diagnosis not present

## 2018-11-08 DIAGNOSIS — M2242 Chondromalacia patellae, left knee: Secondary | ICD-10-CM | POA: Diagnosis not present

## 2018-11-08 DIAGNOSIS — M23222 Derangement of posterior horn of medial meniscus due to old tear or injury, left knee: Secondary | ICD-10-CM | POA: Diagnosis not present

## 2018-11-15 DIAGNOSIS — S8332XD Tear of articular cartilage of left knee, current, subsequent encounter: Secondary | ICD-10-CM | POA: Diagnosis not present

## 2018-11-15 DIAGNOSIS — M25562 Pain in left knee: Secondary | ICD-10-CM | POA: Diagnosis not present

## 2018-11-15 DIAGNOSIS — M2242 Chondromalacia patellae, left knee: Secondary | ICD-10-CM | POA: Diagnosis not present

## 2018-11-15 DIAGNOSIS — M23222 Derangement of posterior horn of medial meniscus due to old tear or injury, left knee: Secondary | ICD-10-CM | POA: Diagnosis not present

## 2018-11-22 DIAGNOSIS — M2242 Chondromalacia patellae, left knee: Secondary | ICD-10-CM | POA: Diagnosis not present

## 2018-11-22 DIAGNOSIS — S8332XD Tear of articular cartilage of left knee, current, subsequent encounter: Secondary | ICD-10-CM | POA: Diagnosis not present

## 2018-11-22 DIAGNOSIS — M23222 Derangement of posterior horn of medial meniscus due to old tear or injury, left knee: Secondary | ICD-10-CM | POA: Diagnosis not present

## 2018-11-29 DIAGNOSIS — S8332XD Tear of articular cartilage of left knee, current, subsequent encounter: Secondary | ICD-10-CM | POA: Diagnosis not present

## 2018-11-29 DIAGNOSIS — M25562 Pain in left knee: Secondary | ICD-10-CM | POA: Diagnosis not present

## 2018-11-29 DIAGNOSIS — M2242 Chondromalacia patellae, left knee: Secondary | ICD-10-CM | POA: Diagnosis not present

## 2018-11-29 DIAGNOSIS — M23222 Derangement of posterior horn of medial meniscus due to old tear or injury, left knee: Secondary | ICD-10-CM | POA: Diagnosis not present

## 2018-11-30 DIAGNOSIS — E291 Testicular hypofunction: Secondary | ICD-10-CM | POA: Diagnosis not present

## 2018-12-06 DIAGNOSIS — S8332XD Tear of articular cartilage of left knee, current, subsequent encounter: Secondary | ICD-10-CM | POA: Diagnosis not present

## 2018-12-06 DIAGNOSIS — M23222 Derangement of posterior horn of medial meniscus due to old tear or injury, left knee: Secondary | ICD-10-CM | POA: Diagnosis not present

## 2018-12-06 DIAGNOSIS — M25562 Pain in left knee: Secondary | ICD-10-CM | POA: Diagnosis not present

## 2018-12-06 DIAGNOSIS — M2242 Chondromalacia patellae, left knee: Secondary | ICD-10-CM | POA: Diagnosis not present

## 2018-12-13 DIAGNOSIS — M23222 Derangement of posterior horn of medial meniscus due to old tear or injury, left knee: Secondary | ICD-10-CM | POA: Diagnosis not present

## 2018-12-13 DIAGNOSIS — S8332XD Tear of articular cartilage of left knee, current, subsequent encounter: Secondary | ICD-10-CM | POA: Diagnosis not present

## 2018-12-13 DIAGNOSIS — M6281 Muscle weakness (generalized): Secondary | ICD-10-CM | POA: Diagnosis not present

## 2018-12-13 DIAGNOSIS — M2242 Chondromalacia patellae, left knee: Secondary | ICD-10-CM | POA: Diagnosis not present

## 2018-12-20 DIAGNOSIS — S8332XD Tear of articular cartilage of left knee, current, subsequent encounter: Secondary | ICD-10-CM | POA: Diagnosis not present

## 2018-12-20 DIAGNOSIS — M2242 Chondromalacia patellae, left knee: Secondary | ICD-10-CM | POA: Diagnosis not present

## 2018-12-20 DIAGNOSIS — M23222 Derangement of posterior horn of medial meniscus due to old tear or injury, left knee: Secondary | ICD-10-CM | POA: Diagnosis not present

## 2018-12-20 DIAGNOSIS — M25562 Pain in left knee: Secondary | ICD-10-CM | POA: Diagnosis not present

## 2018-12-27 DIAGNOSIS — M2242 Chondromalacia patellae, left knee: Secondary | ICD-10-CM | POA: Diagnosis not present

## 2018-12-27 DIAGNOSIS — M23222 Derangement of posterior horn of medial meniscus due to old tear or injury, left knee: Secondary | ICD-10-CM | POA: Diagnosis not present

## 2018-12-27 DIAGNOSIS — M25562 Pain in left knee: Secondary | ICD-10-CM | POA: Diagnosis not present

## 2020-11-16 ENCOUNTER — Encounter: Payer: Self-pay | Admitting: Emergency Medicine

## 2020-11-16 ENCOUNTER — Ambulatory Visit: Payer: Self-pay

## 2020-11-16 ENCOUNTER — Ambulatory Visit
Admission: EM | Admit: 2020-11-16 | Discharge: 2020-11-16 | Disposition: A | Discharge status: Home / Self Care | Payer: BLUE CROSS/BLUE SHIELD | Attending: Family Medicine | Admitting: Family Medicine

## 2020-11-16 ENCOUNTER — Other Ambulatory Visit: Payer: Self-pay

## 2020-11-16 DIAGNOSIS — J3489 Other specified disorders of nose and nasal sinuses: Secondary | ICD-10-CM

## 2020-11-16 DIAGNOSIS — Z1152 Encounter for screening for COVID-19: Secondary | ICD-10-CM | POA: Diagnosis not present

## 2020-11-16 DIAGNOSIS — R509 Fever, unspecified: Secondary | ICD-10-CM

## 2020-11-16 DIAGNOSIS — J069 Acute upper respiratory infection, unspecified: Secondary | ICD-10-CM

## 2020-11-16 MED ORDER — DEXAMETHASONE SODIUM PHOSPHATE 10 MG/ML IJ SOLN
10.0000 mg | Freq: Once | INTRAMUSCULAR | Status: AC
  Administered 2020-11-16: 10 mg via INTRAMUSCULAR

## 2020-11-16 NOTE — ED Triage Notes (Signed)
Patient c/o nasal congestion x 2 days.   Patient c/o sinus pressure that started last night.   Patient endorses " having sweats at night".   Patient endorses headache. Patient endorses " it feels like the pain is going into my teeth, like my teeth are hurting now".   Patient took 800 mg of ibuprofen at 1300 today.

## 2020-11-16 NOTE — ED Provider Notes (Signed)
[facility]    CSN: [id number] Arrival date & time: 11/16/20  1324      History   Chief Complaint Chief Complaint  Patient presents with   Nasal Congestion   Facial Pain   APPT 1400    HPI Charles Vincent is a 40 y.o. male.   Patient presenting today with 2-day history of nasal congestion, sinus pain and pressure, sinus headache, chills, sweats and now pain going into his teeth.  Took some ibuprofen this morning but otherwise has not taken anything over-the-counter for symptoms.  No known history of seasonal allergies or other pertinent chronic medical problems.  No known sick contacts recently.   Past Medical History:  Diagnosis Date   Barrett's esophagus    Barrett's esophagus    Followed by Dr. Gerilyn Nestle in [place]    Bronchitis    GERD (gastroesophageal reflux disease)     Patient Active Problem List   Diagnosis Date Noted   GERD (gastroesophageal reflux disease) 03/13/2011   Barrett esophagus 03/13/2011   Barrett's esophagus 03/13/2011    Past Surgical History:  Procedure Laterality Date   ESOPHAGOGASTRODUODENOSCOPY N/A 04/13/2014   Procedure: ESOPHAGOGASTRODUODENOSCOPY (EGD);  Surgeon: Malissa Hippo, MD;  Location: [facility];  Service: Endoscopy;  Laterality: N/A;  1200 - moved to 3/31 @ 1:00   WISDOM TOOTH EXTRACTION         Home Medications    Prior to Admission medications   Not on File    Family History Family History  Problem Relation Age of Onset   Hypertension Mother    Diabetes Father    Hyperlipidemia Father    Hypertension Father    Healthy Daughter     Social History Social History   Tobacco Use   Smoking status: Former    Packs/day: 1.50    Years: 15.00    Pack years: 22.50    Types: Cigarettes    Quit date: 09/28/2012    Years since quitting: 8.1   Smokeless tobacco: Never  Substance Use Topics   Alcohol use: No   Drug use: No     Allergies   Patient has no known allergies.   Review of  Systems Review of Systems Per HPI  Physical Exam Triage Vital Signs ED Triage Vitals  Enc Vitals Group     BP 11/16/20 1359 (!) 142/86     Pulse Rate 11/16/20 1359 78     Resp 11/16/20 1359 16     Temp 11/16/20 1359 100.2 F (37.9 C)     Temp Source 11/16/20 1359 Oral     SpO2 11/16/20 1359 96 %     Weight 11/16/20 1359 185 lb (83.9 kg)     Height 11/16/20 1359 5\' 8"  (1.727 m)     Head Circumference --      Peak Flow --      Pain Score 11/16/20 1358 8     Pain Loc --      Pain Edu? --      Excl. in GC? --    No data found.  Updated Vital Signs BP (!) 142/86 (BP Location: Right Arm)   Pulse 78   Temp 100.2 F (37.9 C) (Oral)   Resp 16   Ht 5\' 8"  (1.727 m)   Wt 185 lb (83.9 kg)   SpO2 96%   BMI 28.13 kg/m   Visual Acuity Right Eye Distance:   Left Eye Distance:   Bilateral Distance:    Right Eye  Near:   Left Eye Near:    Bilateral Near:     Physical Exam Vitals and nursing note reviewed.  Constitutional:      Appearance: Normal appearance.  HENT:     Head: Atraumatic.     Right Ear: Tympanic membrane normal.     Left Ear: Tympanic membrane normal.     Nose: Rhinorrhea present.     Mouth/Throat:     Mouth: Mucous membranes are moist.     Pharynx: Posterior oropharyngeal erythema present. No oropharyngeal exudate.  Eyes:     Extraocular Movements: Extraocular movements intact.     Conjunctiva/sclera: Conjunctivae normal.  Cardiovascular:     Rate and Rhythm: Normal rate and regular rhythm.  Pulmonary:     Effort: Pulmonary effort is normal. No respiratory distress.     Breath sounds: Normal breath sounds. No wheezing or rales.  Musculoskeletal:        General: Normal range of motion.     Cervical back: Normal range of motion and neck supple.  Skin:    General: Skin is warm and dry.  Neurological:     General: No focal deficit present.     Mental Status: He is oriented to person, place, and time.  Psychiatric:        Mood and Affect: Mood normal.         Thought Content: Thought content normal.        Judgment: Judgment normal.     UC Treatments / Results  Labs (all labs ordered are listed, but only abnormal results are displayed) Labs Reviewed  COVID-19, FLU A+B NAA    EKG   Radiology No results found.  Procedures Procedures (including critical care time)  Medications Ordered in UC Medications  dexamethasone (DECADRON) injection 10 mg (10 mg Intramuscular Given 11/16/20 1438)    Initial Impression / Assessment and Plan / UC Course  I have reviewed the triage vital signs and the nursing notes.  Pertinent labs & imaging results that were available during my care of the patient were reviewed by me and considered in my medical decision making (see chart for details).     Low-grade fever in triage, otherwise vital signs reassuring.  He appears in no acute distress.  Suspect COVID or flu, these tests are pending at this time.  We will treat with IM Decadron for sinus pressure and inflammation, Flonase, Sudafed, ibuprofen and Tylenol for pain and fever.  Discussed protocol for quarantine, return precautions.  Work note given.  Final Clinical Impressions(s) / UC Diagnoses   Final diagnoses:  Encounter for screening for COVID-19  Viral URI  Sinus pressure  Fever, unspecified     Discharge Instructions      Take Flonase twice daily, Sudafed as needed to help with symptoms.     ED Prescriptions   None    PDMP not reviewed this encounter.   Particia Nearing, [place] 11/16/20 1451

## 2020-11-16 NOTE — Discharge Instructions (Signed)
Take Flonase twice daily, Sudafed as needed to help with symptoms.

## 2020-11-17 LAB — COVID-19, FLU A+B NAA
Influenza A, NAA: NOT DETECTED
Influenza B, NAA: NOT DETECTED
SARS-CoV-2, NAA: NOT DETECTED

## 2021-01-17 ENCOUNTER — Other Ambulatory Visit: Payer: Self-pay | Admitting: Internal Medicine

## 2021-01-17 ENCOUNTER — Other Ambulatory Visit (HOSPITAL_COMMUNITY): Payer: Self-pay | Admitting: Internal Medicine

## 2021-01-17 DIAGNOSIS — M25562 Pain in left knee: Secondary | ICD-10-CM

## 2021-02-04 ENCOUNTER — Other Ambulatory Visit: Payer: Self-pay

## 2021-02-04 ENCOUNTER — Ambulatory Visit (HOSPITAL_COMMUNITY)
Admission: RE | Admit: 2021-02-04 | Discharge: 2021-02-04 | Disposition: A | Discharge status: Home / Self Care | Payer: BLUE CROSS/BLUE SHIELD | Source: Ambulatory Visit | Attending: Internal Medicine | Admitting: Internal Medicine

## 2021-02-04 DIAGNOSIS — M25562 Pain in left knee: Secondary | ICD-10-CM | POA: Insufficient documentation

## 2021-08-17 DIAGNOSIS — E559 Vitamin D deficiency, unspecified: Secondary | ICD-10-CM | POA: Diagnosis not present

## 2021-08-17 DIAGNOSIS — H6123 Impacted cerumen, bilateral: Secondary | ICD-10-CM | POA: Diagnosis not present

## 2021-08-17 DIAGNOSIS — E6609 Other obesity due to excess calories: Secondary | ICD-10-CM | POA: Diagnosis not present

## 2021-08-17 DIAGNOSIS — R011 Cardiac murmur, unspecified: Secondary | ICD-10-CM | POA: Diagnosis not present

## 2021-08-17 DIAGNOSIS — K219 Gastro-esophageal reflux disease without esophagitis: Secondary | ICD-10-CM | POA: Diagnosis not present

## 2021-11-15 DIAGNOSIS — M1712 Unilateral primary osteoarthritis, left knee: Secondary | ICD-10-CM | POA: Diagnosis not present

## 2022-03-26 DIAGNOSIS — M1712 Unilateral primary osteoarthritis, left knee: Secondary | ICD-10-CM | POA: Diagnosis not present

## 2022-05-16 DIAGNOSIS — M25562 Pain in left knee: Secondary | ICD-10-CM | POA: Diagnosis not present

## 2022-09-03 DIAGNOSIS — E782 Mixed hyperlipidemia: Secondary | ICD-10-CM | POA: Diagnosis not present

## 2022-09-03 DIAGNOSIS — I10 Essential (primary) hypertension: Secondary | ICD-10-CM | POA: Diagnosis not present

## 2022-09-03 DIAGNOSIS — Z1330 Encounter for screening examination for mental health and behavioral disorders, unspecified: Secondary | ICD-10-CM | POA: Diagnosis not present

## 2022-09-03 DIAGNOSIS — E291 Testicular hypofunction: Secondary | ICD-10-CM | POA: Diagnosis not present

## 2022-09-03 DIAGNOSIS — E559 Vitamin D deficiency, unspecified: Secondary | ICD-10-CM | POA: Diagnosis not present

## 2022-09-03 DIAGNOSIS — R7301 Impaired fasting glucose: Secondary | ICD-10-CM | POA: Diagnosis not present

## 2022-09-03 DIAGNOSIS — Z0000 Encounter for general adult medical examination without abnormal findings: Secondary | ICD-10-CM | POA: Diagnosis not present

## 2023-03-16 IMAGING — MR MR KNEE*L* W/O CM
8 series · 40 of 40 positions shown · non-contrast
Comparison: MR knee 09/21/2018; X-ray knee 09/14/2018.

CLINICAL DATA: Left knee pain for over 2 years.

EXAM:
MRI OF THE LEFT KNEE WITHOUT CONTRAST
TECHNIQUE: Multiplanar, multisequence MR imaging of the knee was performed. No
intravenous contrast was administered.

[Series 8: T2 fat-sat · axial · left · 4.0mm · 0.47mm/px · z∈[-61,+64]mm · 4 of 26 slices shown (1 of 4)]
[im 1/26]
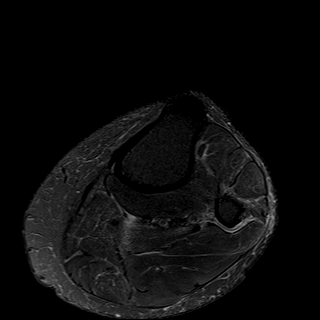
[im 9/26]
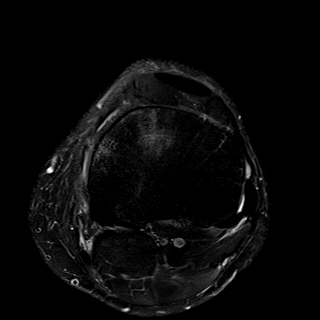
[im 17/26]
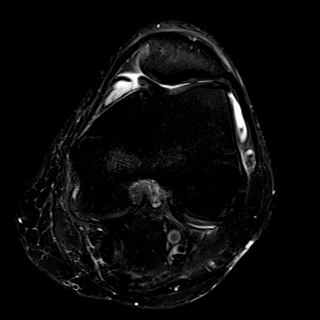
[im 26/26]
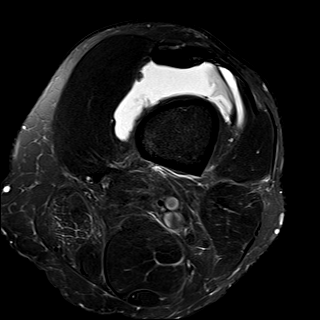

[Series 9: T1 · coronal · left · 4.0mm · 0.59mm/px · 5 of 26 slices shown]
[im 1/26]
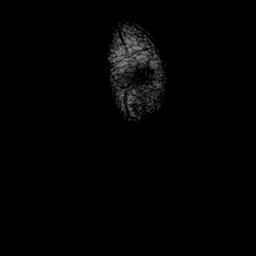
[im 7/26]
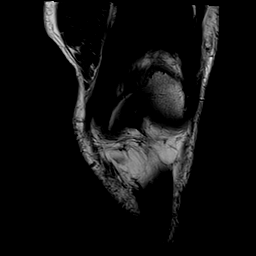
[im 13/26]
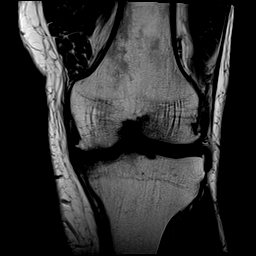
[im 19/26]
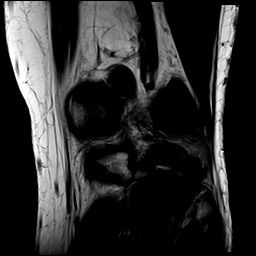
[im 26/26]
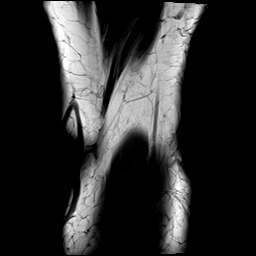

[Series 10: T2 fat-sat · coronal · left · 4.0mm · 0.59mm/px · 5 of 28 slices shown (2 of 4)]
[im 1/28]
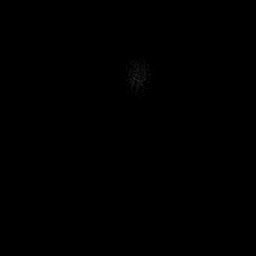
[im 7/28]
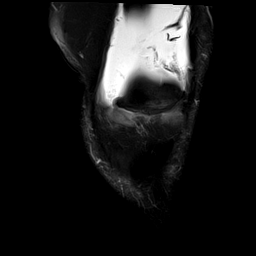
[im 14/28]
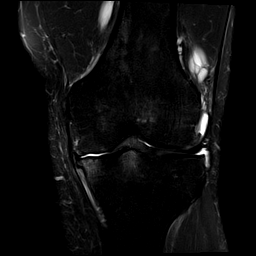
[im 21/28]
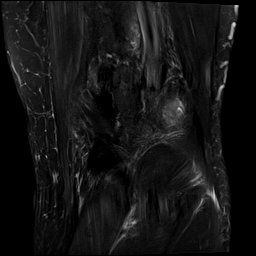
[im 28/28]
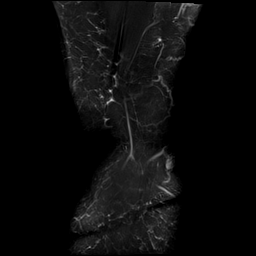

[Series 11: PD fat-sat · coronal · left · 4.0mm · 0.59mm/px · 5 of 28 slices shown (1 of 2)]
[im 1/28]
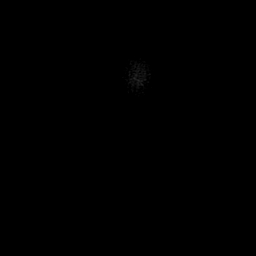
[im 7/28]
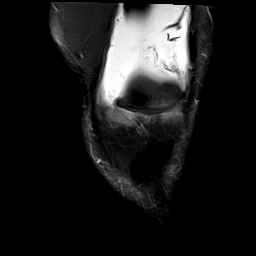
[im 14/28]
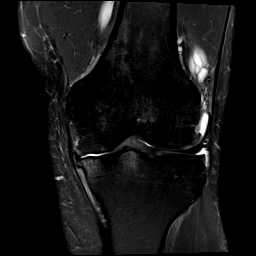
[im 21/28]
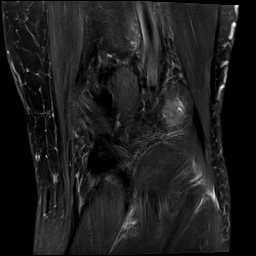
[im 28/28]
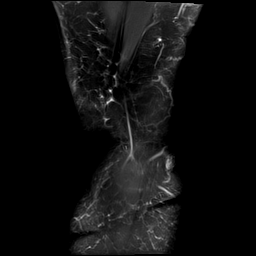

[Series 12: PD fat-sat · sagittal · left · 3.0mm · 0.52mm/px · 6 of 30 slices shown (2 of 2)]
[im 1/30]
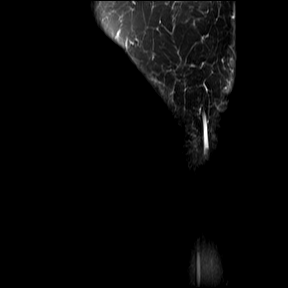
[im 6/30]
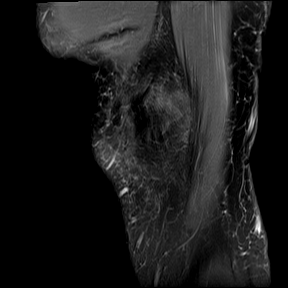
[im 12/30]
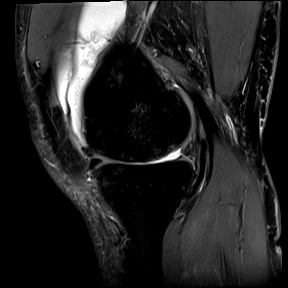
[im 18/30]
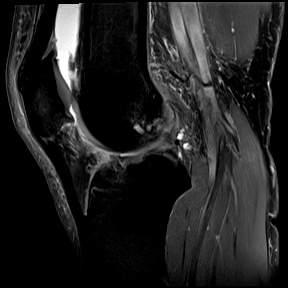
[im 24/30]
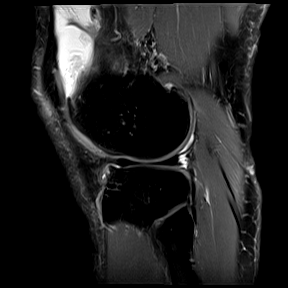
[im 30/30]
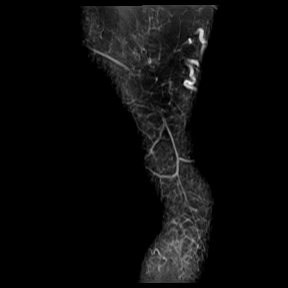

[Series 13: T2 fat-sat · sagittal · left · 3.0mm · 0.59mm/px · 6 of 30 slices shown (3 of 4)]
[im 1/30]
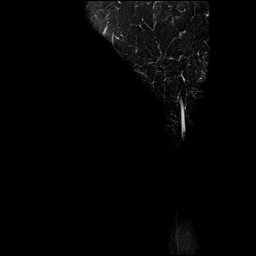
[im 6/30]
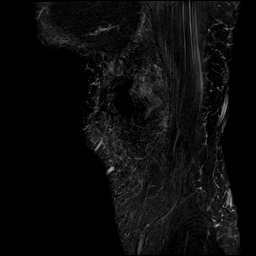
[im 12/30]
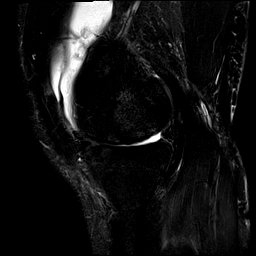
[im 18/30]
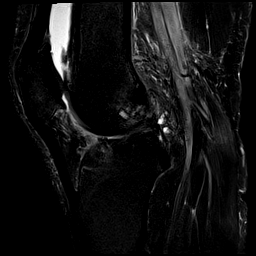
[im 24/30]
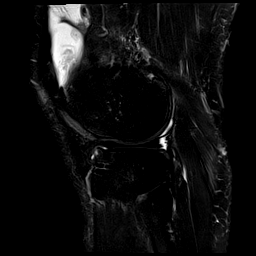
[im 30/30]
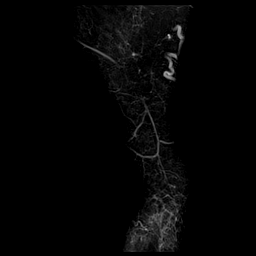

[Series 14: PD · oblique · left · 2.0mm · 0.47mm/px · 3 of 14 slices shown]
[im 1/14]
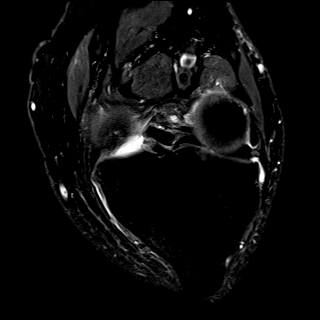
[im 7/14]
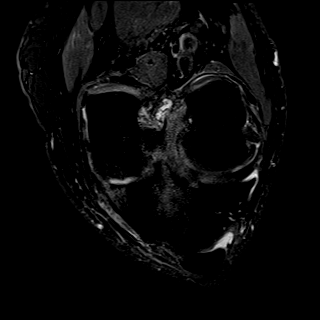
[im 14/14]
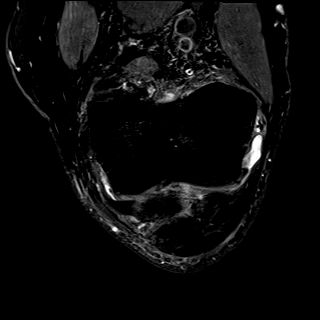

[Series 15: T2 fat-sat · sagittal · left · 3.0mm · 0.70mm/px · 6 of 30 slices shown (4 of 4)]
[im 1/30]
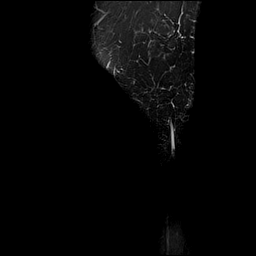
[im 6/30]
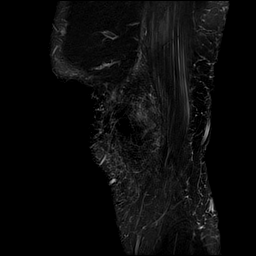
[im 12/30]
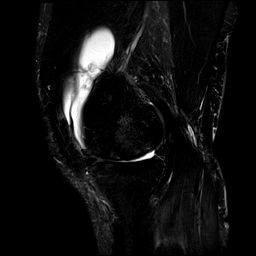
[im 18/30]
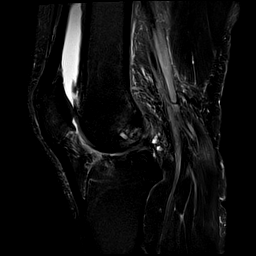
[im 24/30]
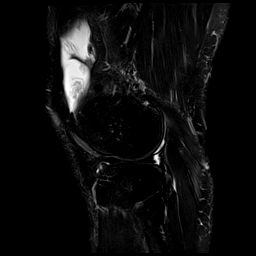
[im 30/30]
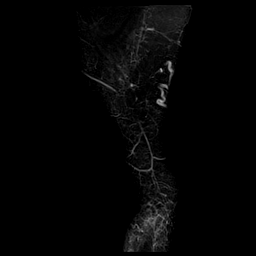

[40 of 40 positions shown; findings below may reference images not displayed]

FINDINGS: MENISCI

Medial meniscus: Complete radial tear of the medial meniscal
posterior horn, progressed. Advanced intrasubstance degeneration
with free edge fraying of the meniscal body and residual posterior
horn.

Lateral meniscus:  Intact.

LIGAMENTS

Cruciates: Advanced mucoid degeneration of the ACL without tear.
Multiple small ganglion cysts along the posterior margin of the ACL
near its femoral attachment. Intact PCL with mild mucoid
degeneration.

Collaterals: Intact MCL. Lateral collateral ligament complex intact.

CARTILAGE

Patellofemoral: Progressive chondral fissuring within the trochlear
groove and medial trochlea.

Medial: Progressive high-grade cartilage loss of the weight-bearing
medial compartment with extensive full-thickness cartilage loss and
underlying subchondral marrow signal changes.

Lateral: New areas of chondral thinning and surface irregularity
along the posterior aspect of the lateral tibial plateau.

MISCELLANEOUS

Joint: Large knee joint effusion with synovitis. Mild edema within
Hoffa's fat.

Popliteal Fossa: No significant Baker's cyst. Intact popliteus
tendon.

Extensor Mechanism: Intact quadriceps and patellar tendons. Mild
patellar tendinosis.

Bones: Tricompartmental joint space narrowing with marginal
osteophyte formation. Reactive subchondral marrow edema within the
medial compartment. No subchondral fracture. Small intraosseous
ganglia at the femoral and tibial attachments of the ACL. No
suspicious bone lesion.

Other: No significant periarticular soft tissue findings.
IMPRESSION: 1. Worsening tricompartmental osteoarthritis of the left knee,
moderate-to-severe within the medial compartment.
2. Complete radial tear of the medial meniscal posterior horn,
progressed.
3. Mucoid degeneration of the cruciate ligaments without tear.
4. Large knee joint effusion with synovitis.
5. Mild patellar tendinosis.
